# Patient Record
Sex: Female | Born: 1985 | Race: Black or African American | Hispanic: No | Marital: Single | Smoking: Former smoker
Health system: Southern US, Community
[De-identification: ages and names within clinical notes are randomized; demographics above are authoritative.]

## PROBLEM LIST (undated history)

## (undated) DIAGNOSIS — R7303 Prediabetes: Secondary | ICD-10-CM

## (undated) DIAGNOSIS — I10 Essential (primary) hypertension: Secondary | ICD-10-CM

## (undated) DIAGNOSIS — F329 Major depressive disorder, single episode, unspecified: Secondary | ICD-10-CM

## (undated) DIAGNOSIS — F32A Depression, unspecified: Secondary | ICD-10-CM

## (undated) DIAGNOSIS — F419 Anxiety disorder, unspecified: Secondary | ICD-10-CM

## (undated) HISTORY — DX: Prediabetes: R73.03

## (undated) HISTORY — PX: NO PAST SURGERIES: SHX2092

---
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
## 2007-05-04 ENCOUNTER — Emergency Department: Payer: Self-pay | Admitting: Emergency Medicine

## 2007-06-02 ENCOUNTER — Emergency Department: Payer: Self-pay | Admitting: Emergency Medicine

## 2008-12-03 ENCOUNTER — Emergency Department: Payer: Self-pay | Admitting: Emergency Medicine

## 2008-12-18 ENCOUNTER — Emergency Department: Payer: Self-pay | Admitting: Emergency Medicine

## 2009-01-29 ENCOUNTER — Emergency Department: Payer: Self-pay | Admitting: Emergency Medicine

## 2013-07-25 ENCOUNTER — Emergency Department: Payer: Self-pay | Admitting: Emergency Medicine

## 2013-08-11 ENCOUNTER — Inpatient Hospital Stay: Payer: Self-pay | Admitting: Psychiatry

## 2013-08-11 LAB — URINALYSIS, COMPLETE
BILIRUBIN, UR: NEGATIVE
GLUCOSE, UR: NEGATIVE mg/dL (ref 0–75)
Ketone: NEGATIVE
Leukocyte Esterase: NEGATIVE
NITRITE: NEGATIVE
PROTEIN: NEGATIVE
Ph: 6 (ref 4.5–8.0)
RBC,UR: 34 /HPF (ref 0–5)
Specific Gravity: 1.009 (ref 1.003–1.030)
Squamous Epithelial: 5
WBC UR: 3 /HPF (ref 0–5)

## 2013-08-11 LAB — COMPREHENSIVE METABOLIC PANEL
ALBUMIN: 3.9 g/dL (ref 3.4–5.0)
ANION GAP: 8 (ref 7–16)
Alkaline Phosphatase: 59 U/L
BUN: 14 mg/dL (ref 7–18)
Bilirubin,Total: 0.3 mg/dL (ref 0.2–1.0)
Calcium, Total: 8.6 mg/dL (ref 8.5–10.1)
Chloride: 107 mmol/L (ref 98–107)
Co2: 24 mmol/L (ref 21–32)
Creatinine: 0.89 mg/dL (ref 0.60–1.30)
EGFR (African American): >60
EGFR (Non-African Amer.): >60
Glucose: 98 mg/dL (ref 65–99)
Osmolality: 278 (ref 275–301)
Potassium: 3.5 mmol/L (ref 3.5–5.1)
SGOT(AST): 20 U/L (ref 15–37)
SGPT (ALT): 15 U/L (ref 12–78)
SODIUM: 139 mmol/L (ref 136–145)
TOTAL PROTEIN: 7.3 g/dL (ref 6.4–8.2)

## 2013-08-11 LAB — SALICYLATE LEVEL: Salicylates, Serum: <1.7 mg/dL

## 2013-08-11 LAB — DRUG SCREEN, URINE

## 2013-08-11 LAB — CBC
HCT: 34 % — ABNORMAL LOW (ref 35.0–47.0)
HGB: 11.5 g/dL — ABNORMAL LOW (ref 12.0–16.0)
MCH: 30.4 pg (ref 26.0–34.0)
MCHC: 33.7 g/dL (ref 32.0–36.0)
MCV: 90 fL (ref 80–100)
PLATELETS: 275 10*3/uL (ref 150–440)
RBC: 3.78 10*6/uL — ABNORMAL LOW (ref 3.80–5.20)
RDW: 14.6 % — ABNORMAL HIGH (ref 11.5–14.5)
WBC: 5.7 10*3/uL (ref 3.6–11.0)

## 2013-08-11 LAB — ETHANOL: Ethanol %: <0.003 % (ref 0.000–0.080)

## 2013-08-11 LAB — PREGNANCY, URINE: Pregnancy Test, Urine: NEGATIVE m[IU]/mL

## 2013-08-11 LAB — ACETAMINOPHEN LEVEL: Acetaminophen: 4 ug/mL — ABNORMAL LOW

## 2013-09-12 ENCOUNTER — Emergency Department: Payer: Self-pay | Admitting: Emergency Medicine

## 2013-09-13 LAB — CBC
HCT: 34.1 % — ABNORMAL LOW (ref 35.0–47.0)
HGB: 11.5 g/dL — ABNORMAL LOW (ref 12.0–16.0)
MCH: 30.8 pg (ref 26.0–34.0)
MCHC: 33.7 g/dL (ref 32.0–36.0)
MCV: 91 fL (ref 80–100)
Platelet: 313 10*3/uL (ref 150–440)
RBC: 3.74 10*6/uL — ABNORMAL LOW (ref 3.80–5.20)
RDW: 15 % — AB (ref 11.5–14.5)
WBC: 5.8 10*3/uL (ref 3.6–11.0)

## 2013-09-13 LAB — URINALYSIS, COMPLETE
BILIRUBIN, UR: NEGATIVE
Bacteria: NONE SEEN
Glucose,UR: NEGATIVE mg/dL (ref 0–75)
Ketone: NEGATIVE
Leukocyte Esterase: NEGATIVE
NITRITE: NEGATIVE
Ph: 6 (ref 4.5–8.0)
Protein: 30
RBC,UR: 8 /HPF (ref 0–5)
SPECIFIC GRAVITY: 1.009 (ref 1.003–1.030)
Squamous Epithelial: 11
WBC UR: 5 /HPF (ref 0–5)

## 2013-09-13 LAB — COMPREHENSIVE METABOLIC PANEL
ALK PHOS: 55 U/L
AST: 19 U/L (ref 15–37)
Albumin: 3.5 g/dL (ref 3.4–5.0)
Anion Gap: 12 (ref 7–16)
BUN: 9 mg/dL (ref 7–18)
Bilirubin,Total: 0.2 mg/dL (ref 0.2–1.0)
CHLORIDE: 108 mmol/L — AB (ref 98–107)
Calcium, Total: 8.2 mg/dL — ABNORMAL LOW (ref 8.5–10.1)
Co2: 23 mmol/L (ref 21–32)
Creatinine: 0.76 mg/dL (ref 0.60–1.30)
EGFR (African American): >60
EGFR (Non-African Amer.): >60
Glucose: 94 mg/dL (ref 65–99)
Osmolality: 283 (ref 275–301)
Potassium: 3.2 mmol/L — ABNORMAL LOW (ref 3.5–5.1)
SGPT (ALT): 20 U/L
SODIUM: 143 mmol/L (ref 136–145)
Total Protein: 7.3 g/dL (ref 6.4–8.2)

## 2013-09-13 LAB — DRUG SCREEN, URINE
Amphetamines, Ur Screen: NEGATIVE (ref ?–1000)
BENZODIAZEPINE, UR SCRN: NEGATIVE (ref ?–200)
Barbiturates, Ur Screen: NEGATIVE (ref ?–200)
CANNABINOID 50 NG, UR ~~LOC~~: NEGATIVE (ref ?–50)
Cocaine Metabolite,Ur ~~LOC~~: NEGATIVE (ref ?–300)
MDMA (ECSTASY) UR SCREEN: POSITIVE (ref ?–500)
Methadone, Ur Screen: NEGATIVE (ref ?–300)
Opiate, Ur Screen: NEGATIVE (ref ?–300)
PHENCYCLIDINE (PCP) UR S: NEGATIVE (ref ?–25)
TRICYCLIC, UR SCREEN: NEGATIVE (ref ?–1000)

## 2013-09-13 LAB — ACETAMINOPHEN LEVEL: Acetaminophen: <2 ug/mL

## 2013-09-13 LAB — ETHANOL
ETHANOL %: 0.167 % — AB (ref 0.000–0.080)
Ethanol: 167 mg/dL

## 2013-09-13 LAB — SALICYLATE LEVEL: SALICYLATES, SERUM: 2.7 mg/dL

## 2014-06-03 NOTE — H&P (Signed)
PATIENT NAME:  Samantha [name], Samantha [name] [name] MR#:  [id number] DATE OF BIRTH:  12-Nov-1985  DATE OF ADMISSION:  08/11/2013 DATE OF EVALUATION:  08/12/2013   REFERRING PHYSICIAN: Emergency Room M.D.   ATTENDING PHYSICIAN: Samantha [name] Samantha [name] [name], M.D.   IDENTIFYING DATA: Samantha [name] [name] is a 29 year old female with history of depression.   CHIEF COMPLAINT: "I felt down."   HISTORY OF PRESENT ILLNESS:  Samantha [name] Samantha [name] [name] reports frequent mood changes and bouts of depression since early childhood. Her family knows that she is moody. When faced with difficulties, she oftentimes becomes depressed and does not want to deal with her difficulties. She has never been treated for depression before. On the day of admission, she overdosed on 12 pills of naproxen. She denies that this was an attempt to kill herself, but she just wanted to feel numb. She has multiple stressors.  She lost her grandfather, with whom she was very close, in June. She is still tearful about it. She works 2 jobs and goes to school full time and takes care of her 31-year-old son. She has financial difficulties and also difficulties in relationships.  In addition, she was charged with assault with a deadly weapon and this limits her opportunities for employment now. She reports poor sleep, decreased appetite, anhedonia, feeling of guilt, hopelessness, worthlessness, poor memory and concentration, crying spells, irritability, poor impulse control, social isolation and passing suicidal ideation. Before she took the pills, she thought about it for a while. She lives with a friend, but a suicide was attempted at her parents house. She let them know and was brought to the hospital. She denies psychotic symptoms, denies symptoms suggestive of bipolar mania and does not think that she has bipolar illness. She has been drinking more so in the past month. She used to drink 2 to 3 times a week, but now she has at least 3 beers each day, oftentimes more. She denies illicit drugs or  prescription pill abuse.   PAST PSYCHIATRIC HISTORY: She has never been hospitalized. Never treated. No suicide attempts. No substance abuse treatments.   FAMILY PSYCHIATRIC HISTORY: None reported.   PAST MEDICAL HISTORY: None.   ALLERGIES: No known drug allergies.   MEDICATIONS ON ADMISSION: None.   SOCIAL HISTORY: She lives with a friend. She is not dating at the moment. She has 2 part-time jobs at [employer] and [employer]. She goes to school on line for [employer] She takes care of her son. She now thinks that maybe she will move in with her mother to lower the costs and take advantage of her support.   REVIEW OF SYSTEMS: CONSTITUTIONAL: No fevers or chills. No weight changes.  EYES: No double or blurred vision.  ENT: No hearing loss.  RESPIRATORY: No shortness of breath or cough.  CARDIOVASCULAR: No chest pain or orthopnea.  GASTROINTESTINAL: No abdominal pain, nausea, vomiting or diarrhea.  GENITOURINARY: No incontinence or frequency.  ENDOCRINE: No heat or cold intolerance.  LYMPHATIC: No anemia or easy bruising.  INTEGUMENTARY: No acne or rash.  MUSCULOSKELETAL: No muscle or joint pain.  NEUROLOGIC: No tingling or weakness.  PSYCHIATRIC: See history of present illness for details.   PHYSICAL EXAMINATION: VITAL SIGNS: Blood pressure 111/75, pulse 64, respirations 20, temperature 98.7.  GENERAL: This is a well-developed female in no acute distress.  HEENT: The pupils are equal, round and reactive to light. Sclerae are anicteric.  NECK: Supple. No thyromegaly.  LUNGS: Clear to auscultation. No dullness to percussion.  HEART: Regular rhythm  and rate. No murmurs, rubs or gallops.  ABDOMEN: Soft, nontender, nondistended. Positive bowel sounds.  MUSCULOSKELETAL: Normal muscle strength in all extremities.  SKIN: No rashes or bruises.  LYMPHATIC: No cervical adenopathy.  NEUROLOGIC: Cranial nerves II through XII are intact.   LABORATORY DATA: Chemistries are  within normal limits. Blood alcohol level is 0. LFTs within normal limits. Urinalysis is not suggestive of urinary tract infection. CBC within normal limits with mild anemia. Urine tox screen negative for substances. Serum acetaminophen and salicylate are low. Urine pregnancy test is negative.   MENTAL STATUS EXAMINATION ON ADMISSION: The patient is alert and oriented to person, place, time and situation. She is pleasant, polite and cooperative. She is well groomed and casually dressed. She maintains good eye contact. Her speech is of normal rhythm, rate and volume. Mood is depressed with flat affect. Thought process is logical and goal oriented. Thought content: She denies thoughts of hurting herself or others. There are no delusions or paranoia. There are no auditory or visual hallucinations. Her cognition is grossly intact. Registration, recall, long and short-term memory is intact. She is of average intelligence and fund of knowledge. Her insight and judgment are improving.   DIAGNOSES: AXIS I:  Major depressive disorder, recurrent, severe. Alcohol abuse.  AXIS II: Deferred.  AXIS III: Deferred.  AXIS IV: Mental illness, substance abuse, financial, primary support.  AXIS V: Global assessment of functioning is 35.   PLAN: The patient was admitted to [facility] behavioral medicine unit for safety, stabilization and medication management. She was initially placed on suicide precautions and was closely monitored for any unsafe behaviors. She underwent full psychiatric and risk assessment. She received pharmacotherapy, individual and group psychotherapy, substance abuse counseling and support from therapeutic milieu.  1.  Suicidal ideation: This has resolved. The patient is able to contract for safety.  2.  Mood: She interested in starting antidepressants. Will start Prozac for depression and trazodone for sleep.  3.  Disposition: She will be discharged to home. She will follow up  with [facility].      ____________________________ Ellin GoodieJolanta B. Jennet MaduroPucilowska, MD jbp:dmm D: 08/12/2013 13:39:45 ET T: 08/12/2013 14:32:39 ET JOB#: [id number]  cc: Samantha B. Jennet MaduroPucilowska, MD, <Dictator> Shari ProwsJOLANTA B PUCILOWSKA MD ELECTRONICALLY SIGNED 08/24/2013 4:50

## 2014-06-03 NOTE — Consult Note (Signed)
PATIENT NAME:  Samantha [name], Samantha [name] MR#:  [id number] DATE OF BIRTH:  1985-07-16  DATE OF CONSULTATION:  09/13/2013  REFERRING PHYSICIAN:   CONSULTING PHYSICIAN:  Audery Amel, MD  IDENTIFYING INFORMATION AND REASON FOR CONSULT: A 29 year old woman with a past history of depression who came into the hospital under IVC because of taking excessive trazodone. Consultation for appropriate disposition.   HISTORY OF PRESENT ILLNESS: Information obtained from the patient and the chart. The patient was brought here after taking an overdose of her trazodone. She takes prescription trazodone p.r.n. for sleep. She reports that on the night in question, she took 4 of the pills, which 400 mg according to her prescription. She states her reason for doing that was that she was really wanting to make sure she slept because 100-200 mg does not always work. She admits that she had been drinking 2 or 3 drinks at the time. She denies that she had any suicidal ideation at all. She does not report that she had had a particularly stressful or upsetting or unpleasant day at the time. She does not admit that she has been feeling particularly depressed. Totally denies suicidal ideation. Says that she only needs trazodone occasionally. She is not taking the Prozac that was prescribed when she was in the hospital last month. She just did not think that it was helpful. She did not go to follow up with outpatient mental health care. The patient had some stress in her life including being a single mother of a 52-year-old child, working at a low wage job, living in a very crowded house. She admits that all of this is stressful, but is not reporting that is making her feel hopeless. Instead, she is able to articulate positive plans for the future.   PAST PSYCHIATRIC HISTORY: She was admitted to the hospital in the beginning of July of this year after taking an overdose. She tells me now that at that time she actually was trying to hurt  herself because she was feeling depressed, but that she did not do the same thing this time. She says that she understands why her stepmother petitioned her because she thought that it was the same kind of event, but the patient denies that it was. Denies any other suicide attempts. Denies any other psychiatric hospitalizations. Denies any other use of mental health medication. She does still admit to drinking on a regular basis, although she is saying it is only a few times a week. Tends to minimize it a little bit, not really feeling like is a very significant problem right now. She denies any other drug abuse.   FAMILY HISTORY: No known family history.   SOCIAL HISTORY: The patient has a 12-year-old son. She lives in a house with 6 of her own younger siblings and her mother, as well as her own child. She works in a [employer]. 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 She admits that this is stressful, but says that she feels positive about coping with it. She is not currently in any kind of significant relationship.   PAST MEDICAL HISTORY: Denies any known significant ongoing medical problems.   SUBSTANCE ABUSE HISTORY: When she was here previously, alcohol abuse was identified as a problem. The patient seems to have not done a great deal about it, although she is reporting less frequent drinking now than she was previously. She is denying any drug use, although she does have MDMA  positive on her drug screen.   CURRENT MEDICATIONS: Trazodone 100 mg p.o. at bedtime p.r.n. for sleep.   ALLERGIES: NO KNOWN DRUG ALLERGIES.   REVIEW OF SYSTEMS: Feeling tired and sleepy this morning. Denies any other physical symptoms. Full review of systems negative.   MENTAL STATUS EXAMINATION: Neatly groomed woman, looks her stated age, cooperative with the interview. Eye contact initially limited as she is waking up, but then becomes appropriate. Psychomotor activity sluggish. Speech is  quiet, but easy to understand. Answers questions fairly fully and appropriately. Affect warmed up as we were talking with smiling later. No tearfulness or depression. Denies suicidal or homicidal ideation. Denies auditory or visual hallucinations. The patient is alert and oriented x 4. Has memory 2 out of 3 objects at 3 minutes. Longer term memory intact. Seems to be of normal intelligence.   LABORATORY RESULTS: Her drug screen is positive for MDMA. I asked her whether she was using any other drugs at all and she denied it. Also is not on any medications that would normally cross react with that. Her chemistry panel shows slightly low potassium of 3.2, nothing else significant.  Alcohol level was 167 when she came in. CBC: Slightly anemic, hematocrit of 34.   URINALYSIS: Positive for protein, probably contaminated with epithelial cells. Pregnancy test negative.   VITAL SIGNS: Most recent blood pressure 96/68, respirations 12, pulse 70, temperature 98.8.   ASSESSMENT:  A 29 year old woman took what she is insisting with a non-suicidal overdose of trazodone. Had been drinking and was mildly intoxicated at the time. Totally denies depression or suicidal symptoms right now. Seems to show pretty good insight about her situation. Has positive plans for the future and is not appear to have a major depression, obviously, going on right now. The patient does not require inpatient hospitalization. She would benefit from being referred back to going to outpatient treatment evaluation. She is not taking the antidepressant, and I see no reason to restart it right now.   TREATMENT PLAN: The patient has been educated about the risks of alcohol use and the way it can lead to impulsive of bad decisions. Encouraged to give that some serious thought about whether the alcohol is a problem. Referred back voluntarily to [facility] for [facility] walk[facility]in [facility].   DIAGNOSIS, PRINCIPAL AND PRIMARY: AXIS I:  Depression, not  otherwise specified.   SECONDARY DIAGNOSES:  AXIS I: Alcohol abuse.  AXIS II: Deferred.  AXIS III: Recovering from trazodone overdose. No sequela.  AXIS IV: Moderate to severe ongoing social stress as noted above.  AXIS V: Functioning at time of evaluation 55.    ____________________________ Audery AmelJohn T. Clapacs, MD jtc:ts D: 09/13/2013 11:05:10 ET T: 09/13/2013 11:14:06 ET JOB#: [id number]  cc: Audery AmelJohn T. Clapacs, MD, <Dictator> Audery AmelJOHN T CLAPACS MD ELECTRONICALLY SIGNED 09/21/2013 0:41

## 2014-06-03 NOTE — Consult Note (Signed)
PATIENT NAME:  Mikle BosworthSOWELL, Josette M MR#:  [id number] DATE OF BIRTH:  1985/07/31  DATE OF CONSULTATION:  08/11/2013  REFERRING PHYSICIAN:  Minna AntisKevin Paduchowski, MD CONSULTING PHYSICIAN:  Ardeen FillersUzma S. Garnetta BuddyFaheem, MD  REASON FOR CONSULTATION: "I overdosed on the pills."  HISTORY OF PRESENT ILLNESS: The patient is a 29 year old female who presented via EMS from home after she took 12 naproxen pills, 500 mg each, at around 7:00 this morning. She was having suicidal ideations and she wanted to hurt herself. She reported that she has been going through depression and having a lot of problems. She just wanted to sleep. She called the ambulance after she took the overdose on the naproxen. She reported that she has been having suicidal ideations for a long period of time. She was thinking about suicide and was having plans of using some kind of pills. The patient reported that she has been feeling depressed for a long time. She was dealing with sadness, depression, and decreased appetite and was thinking about different stuff. She feels hopeless and helpless with crying spells. The patient currently lives with a friend. 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 She reported that she is unable to control herself. The patient currently denied using any drugs or alcohol at this time. She reported that after she actually took the pills she thought about her 27-year-old son and then decided to call for help. She is unable to contract for safety at this time.   PAST PSYCHIATRIC HISTORY: The patient reported that she has history of depression for a long period of time, but she is not taking any psychotropic medications at this time. She is asking for help. She stated that she has never been admitted to a psychiatric hospital, but she wants to see one. She has never been treated for depression in the past.   FAMILY HISTORY: The patient currently denied any family history of depression as well as suicide attempts.    SUBSTANCE ABUSE HISTORY: The patient reported that she drinks occasionally, but now for the past few days she has been drinking almost on a daily basis, either beer or wine, to help with her depression. She denies any use of illicit drugs, but she has used pills in the past.   MEDICAL HISTORY: The patient currently denied having any medical problems.   ALLERGIES: No known drug allergies.   CURRENT MEDICATIONS: None reported.   SOCIAL HISTORY: The patient currently lives with a roommate. She has a 27-year-old son who lives with her. She has family who lives around her.   ANCILLARY DATA: Temperature 98.3, pulse 74, respirations 16, blood pressure 122/81.  LABORATORY DATA: Glucose 98, BUN 14, creatinine 0.89, sodium 139, potassium 3.5, chloride 107, bicarbonate 24, anion gap 8, osmolality 278, calcium 8.6. Blood alcohol level less than 3. Protein 7.3, albumin 3.9, bilirubin 0.3, alkaline phosphatase 59, AST 20, ALT 15. UDS is negative. WBC 5.7, RBC 3.78, hemoglobin 11.5, hematocrit 34, platelet count 275,000, MCV 90 and RDW is 14.6   REVIEW OF SYSTEMS: CONSTITUTIONAL: Denies any fever or chills. No weight changes.  EYES: No double or blurred vision.  RESPIRATORY: No shortness of breath or cough.  CARDIOVASCULAR: No chest pain or orthopnea.  GASTROINTESTINAL: No abdominal pain, nausea, vomiting or diarrhea.  GENITOURINARY: No incontinence or frequency.  ENDOCRINE: No heat or cold intolerance.  LYMPHATIC: No anemia or easy bruising.  INTEGUMENTARY: No acne or rash.  MUSCULOSKELETAL: No muscle or joint pain.  NEUROLOGIC: No  tingling or weakness.   MENTAL STATUS EXAMINATION: The patient is a moderately built female who was lying in the ED room. She appeared tired and depressed. Her muscle tone appears normal. Gait and station was not tested. Speech was low in tone and volume. Mood was depressed. Affect was congruent. No loose associations are noted. Thought process was logical, goal-directed.  She just attempted suicide and is unable to contract for safety. Her insight and judgment are poor. She was awake and somewhat sedated. Recent and remote memory were intact. Attention span and concentration were short. Her language and fund of knowledge appears appropriate. Mood was depressed and affect was blunted.   DIAGNOSTIC IMPRESSION: AXIS I: Major depressive disorder, recurrent, severe, without psychotic features.  AXIS II: None.  AXIS III: None reported.   TREATMENT PLAN: 1.  The patient will be admitted to the inpatient behavioral health unit for stabilization and safety.  2.  Once she becomes clinically stable, she will be started on the medications by the treating physician in the unit. I will not start her on any medications at this time. The patient agreed for admission at this time. She will be monitored closely by the staff.   Thank you for allowing me to participate in the care of this patient.   ____________________________ Ardeen Fillers. Garnetta Buddy, MD usf:sb D: 08/11/2013 12:05:11 ET T: 08/11/2013 12:38:58 ET JOB#: [id number]  cc: Ardeen Fillers. Garnetta Buddy, MD, <Dictator> Rhunette Croft MD ELECTRONICALLY SIGNED 08/14/2013 11:52

## 2014-12-22 ENCOUNTER — Ambulatory Visit
Admission: EM | Admit: 2014-12-22 | Discharge: 2014-12-22 | Disposition: A | Discharge status: Home / Self Care | Payer: Medicaid Other | Attending: Family Medicine | Admitting: Family Medicine

## 2014-12-22 DIAGNOSIS — R300 Dysuria: Secondary | ICD-10-CM | POA: Insufficient documentation

## 2014-12-22 DIAGNOSIS — F17210 Nicotine dependence, cigarettes, uncomplicated: Secondary | ICD-10-CM | POA: Diagnosis not present

## 2014-12-22 LAB — URINALYSIS COMPLETE WITH MICROSCOPIC (ARMC ONLY): pH: 0 — ABNORMAL LOW (ref 5.0–8.0)

## 2014-12-22 LAB — CHLAMYDIA/NGC RT PCR (ARMC ONLY)
Chlamydia Tr: NOT DETECTED
N GONORRHOEAE: NOT DETECTED

## 2014-12-22 LAB — PREGNANCY, URINE: PREG TEST UR: NEGATIVE

## 2014-12-22 NOTE — ED Provider Notes (Addendum)
CSN: [id number]     Arrival date & time 12/22/14  [id number] History   None    Chief Complaint  Patient presents with  . Cystitis   (Consider location/radiation/quality/duration/timing/severity/associated sxs/prior Treatment) HPI Comments: 29 yo female with h/o urinary frequency and suprapubic discomfort for the last 2-3 days. Denies any fevers, chills, vomiting, back pain. States took some otc AZO several hours ago, this morning.  The history is provided by the patient.    History reviewed. No pertinent past medical history. History reviewed. No pertinent past surgical history. History reviewed. No pertinent family history. Social History  Substance Use Topics  . Smoking status: Current Every Day Smoker -- 0.50 packs/day    Types: Cigarettes  . Smokeless tobacco: None  . Alcohol Use: Yes     Comment: socially   OB History    Gravida Para Term Preterm AB TAB SAB Ectopic Multiple Living   2    1     1      Review of Systems  Allergies  Review of patient's allergies indicates no known allergies.  Home Medications   Prior to Admission medications   Medication Sig Start Date End Date Taking? Authorizing Provider  phenazopyridine (PYRIDIUM) 95 MG tablet Take 95 mg by mouth 3 (three) times daily as needed for pain.   Yes Historical Provider, MD  Phenazopyridine HCl & UTI Test (URISTAT UTI RELIEF PAK CO) by Combination route.   Yes Historical Provider, MD  sulfamethoxazole-trimethoprim (BACTRIM DS,SEPTRA DS) 800-160 MG tablet Take 1 tablet by mouth 2 (two) times daily. 12/25/14   Darien Ramusavid W Kaminski, MD   Meds Ordered and Administered this Visit  Medications - No data to display  BP 114/72 mmHg  Pulse 80  Temp(Src) 97.1 F (36.2 C) (Tympanic)  Resp 16  Ht 5\' 5"  (1.651 m)  Wt 138 lb (62.596 kg)  BMI 22.96 kg/m2  SpO2 99%  LMP 12/14/2014 (Exact Date) No data found.   Physical Exam  Constitutional: She appears well-developed and well-nourished. No distress.  Cardiovascular:  Normal rate.   Pulmonary/Chest: Effort normal. No respiratory distress.  Abdominal: Soft. Bowel sounds are normal. She exhibits no distension. There is no tenderness. There is no rebound and no guarding.  Skin: She is not diaphoretic.  Nursing note and vitals reviewed.   ED Course  Procedures (including critical care time)  Labs Review Labs Reviewed  URINALYSIS COMPLETEWITH MICROSCOPIC ([facility] ONLY) - Abnormal; Notable for the following:    Color, Urine ORANGE (*)    APPearance CLOUDY (*)    Glucose, UA   (*)    Value: TEST NOT REPORTED DUE TO COLOR INTERFERENCE OF URINE PIGMENT   Bilirubin Urine   (*)    Value: TEST NOT REPORTED DUE TO COLOR INTERFERENCE OF URINE PIGMENT   Ketones, ur   (*)    Value: TEST NOT REPORTED DUE TO COLOR INTERFERENCE OF URINE PIGMENT   Hgb urine dipstick   (*)    Value: TEST NOT REPORTED DUE TO COLOR INTERFERENCE OF URINE PIGMENT   pH 0.0 (*)    Protein, ur   (*)    Value: TEST NOT REPORTED DUE TO COLOR INTERFERENCE OF URINE PIGMENT   Nitrite   (*)    Value: TEST NOT REPORTED DUE TO COLOR INTERFERENCE OF URINE PIGMENT   Leukocytes, UA   (*)    Value: TEST NOT REPORTED DUE TO COLOR INTERFERENCE OF URINE PIGMENT   Bacteria, UA MANY (*)    Squamous Epithelial / LPF  0-5 (*)    All other components within normal limits  URINE CULTURE  CHLAMYDIA/NGC RT PCR ([facility] ONLY)  PREGNANCY, URINE    Imaging Review No results found.   Visual Acuity Review  Right Eye Distance:   Left Eye Distance:   Bilateral Distance:    Right Eye Near:   Left Eye Near:    Bilateral Near:         MDM   1. Dysuria    1. Lab result (negative UA) and possible diagnosis 2. Recommend supportive treatment with increased water intake 3. Check urine culture 4. Follow-up prn if symptoms worsen or don't improve  Discharge Medication List as of 12/22/2014 10:45 AM        Payton Mccallum, MD 12/26/14 [id number]  Payton Mccallum, MD 12/27/14 [phone]

## 2014-12-22 NOTE — ED Notes (Signed)
Started first of this week with urinary frequency and burning and suprapubic pain

## 2014-12-24 LAB — URINE CULTURE: Culture: >=100000

## 2014-12-25 ENCOUNTER — Emergency Department
Admission: EM | Admit: 2014-12-25 | Discharge: 2014-12-25 | Disposition: A | Discharge status: Home / Self Care | Payer: Medicaid Other | Attending: Emergency Medicine | Admitting: Emergency Medicine

## 2014-12-25 ENCOUNTER — Encounter: Payer: Self-pay | Admitting: Emergency Medicine

## 2014-12-25 DIAGNOSIS — N390 Urinary tract infection, site not specified: Secondary | ICD-10-CM | POA: Insufficient documentation

## 2014-12-25 DIAGNOSIS — Z3202 Encounter for pregnancy test, result negative: Secondary | ICD-10-CM | POA: Diagnosis not present

## 2014-12-25 DIAGNOSIS — F17210 Nicotine dependence, cigarettes, uncomplicated: Secondary | ICD-10-CM | POA: Diagnosis not present

## 2014-12-25 DIAGNOSIS — R109 Unspecified abdominal pain: Secondary | ICD-10-CM | POA: Diagnosis present

## 2014-12-25 LAB — URINALYSIS COMPLETE WITH MICROSCOPIC (ARMC ONLY)
Bilirubin Urine: NEGATIVE
Glucose, UA: NEGATIVE mg/dL
Ketones, ur: NEGATIVE mg/dL
Nitrite: POSITIVE — AB
PH: 6 (ref 5.0–8.0)
Protein, ur: 100 mg/dL — AB
Specific Gravity, Urine: 1.013 (ref 1.005–1.030)

## 2014-12-25 LAB — POCT PREGNANCY, URINE: PREG TEST UR: NEGATIVE

## 2014-12-25 MED ORDER — SULFAMETHOXAZOLE-TRIMETHOPRIM 800-160 MG PO TABS: 1.0000 | ORAL_TABLET | Freq: Two times a day (BID) | ORAL | Status: DC

## 2014-12-25 MED ORDER — SULFAMETHOXAZOLE-TRIMETHOPRIM 800-160 MG PO TABS
1.0000 | ORAL_TABLET | Freq: Once | ORAL | Status: AC
  Administered 2014-12-25: 1 via ORAL
  Filled 2014-12-25: qty 1

## 2014-12-25 NOTE — ED Notes (Signed)
Pt reports painful urination and odor for about one week. Denies any vaginal discharge. [facility] same but is worse.

## 2014-12-25 NOTE — Discharge Instructions (Signed)
Your urine culture shows you have pansensitive Escherichia coli bacteria. Take Bactrim as prescribed. Follow-up with your regular doctor or with [facility] if you're not getting better. Return to the emergency department if you feel worse, if you have a fever, or if you have other urgent concerns.  Urinary Tract Infection A urinary tract infection (UTI) can occur any place along the urinary tract. The tract includes the kidneys, ureters, bladder, and urethra. A type of germ called bacteria often causes a UTI. UTIs are often helped with antibiotic medicine.  HOME CARE   If given, take antibiotics as told by your doctor. Finish them even if you start to feel better.  Drink enough fluids to keep your pee (urine) clear or pale yellow.  Avoid tea, drinks with caffeine, and bubbly (carbonated) drinks.  Pee often. Avoid holding your pee in for a long time.  Pee before and after having sex (intercourse).  Wipe from front to back after you poop (bowel movement) if you are a woman. Use each tissue only once. GET HELP RIGHT AWAY IF:   You have back pain.  You have lower belly (abdominal) pain.  You have chills.  You feel sick to your stomach (nauseous).  You throw up (vomit).  Your burning or discomfort with peeing does not go away.  You have a fever.  Your symptoms are not better in 3 days. MAKE SURE YOU:   Understand these instructions.  Will watch your condition.  Will get help right away if you are not doing well or get worse.   This information is not intended to replace advice given to you by your health care provider. Make sure you discuss any questions you have with your health care provider.   Document Released: 07/16/2007 Document Revised: 02/17/2014 Document Reviewed: 08/28/2011 Elsevier Interactive Patient Education [employer]2016 [employer]

## 2014-12-25 NOTE — ED Notes (Signed)
Pt to ed with c/o lower abd pain and pelvic pain,  Pt states urine has strong foul odor and is orange in color. Denies vaginal d/c.

## 2014-12-25 NOTE — ED Notes (Signed)
Final report of urine route of STD testing negative

## 2014-12-25 NOTE — ED Provider Notes (Addendum)
[facility] Emergency Department Provider Note  ____________________________________________  Time seen: 1510  I have reviewed the triage vital signs and the nursing notes.   HISTORY  Chief Complaint Pelvic Pain  dysuria    HPI VANDELLA ORD is a 29 y.o. female who reports she has been having discomfort with urination for proximally one week. This past Friday she went to [facility] urgent care. She had been taking Azo, which likely interfered with urinalysis, however she was told she did not have a urinary tract infection. They did obtain a culture. The culture shows that she has pansensitive Escherichia coli.  The patient presents to the emergency department today because her symptoms are continuing. She denies any fever. She does have some discomfort in her lower abdomen pelvic area. She denies any fever, nausea, vomiting, or diarrhea.  Patient denies any vaginal discharge or vaginal bleeding.     History reviewed. No pertinent past medical history.  There are no active problems to display for this patient.   History reviewed. No pertinent past surgical history.  Current Outpatient Rx  Name  Route  Sig  Dispense  Refill  . phenazopyridine (PYRIDIUM) 95 MG tablet   Oral   Take 95 mg by mouth 3 (three) times daily as needed for pain.         Marland Kitchen Phenazopyridine HCl & UTI Test (URISTAT UTI RELIEF PAK CO)   Combination   by Combination route.         . sulfamethoxazole-trimethoprim (BACTRIM DS,SEPTRA DS) 800-160 MG tablet   Oral   Take 1 tablet by mouth 2 (two) times daily.   11 tablet   0     Allergies Review of patient's allergies indicates no known allergies.  History reviewed. No pertinent family history.  Social History Social History  Substance Use Topics  . Smoking status: Current Every Day Smoker -- 0.50 packs/day    Types: Cigarettes  . Smokeless tobacco: None  . Alcohol Use: Yes     Comment: socially    Review of  Systems  Constitutional: Negative for fatigue. ENT: Negative for congestion. Cardiovascular: Negative for chest pain. Respiratory: Negative for cough. Gastrointestinal: Negative for abdominal pain, vomiting and diarrhea. Genitourinary: Positive for dysuria and lower pelvic pain.. Musculoskeletal: No myalgias or injuries. Skin: Negative for rash. Neurological: Negative for headache or focal weakness   10-point ROS otherwise negative.  ____________________________________________   PHYSICAL EXAM:  VITAL SIGNS: ED Triage Vitals  Enc Vitals Group     BP 12/25/14 1153 106/70 mmHg     Pulse Rate 12/25/14 1153 81     Resp 12/25/14 1153 18     Temp 12/25/14 1153 98.5 F (36.9 C)     Temp Source 12/25/14 1153 Oral     SpO2 12/25/14 1153 100 %     Weight 12/25/14 1153 135 lb (61.236 kg)     Height 12/25/14 1153  (1.651 m)     Head Cir --      Peak Flow --      Pain Score 12/25/14 0946 10     Pain Loc --      Pain Edu? --      Excl. in GC? --     Constitutional: Alert and oriented. Well appearing and in no distress. ENT   Head: Normocephalic and atraumatic.   Nose: No congestion/rhinnorhea.       Mouth: No erythema, no swelling   Cardiovascular: Normal rate, regular rhythm, no murmur noted Respiratory:  Normal respiratory effort, no tachypnea.    Breath sounds are clear and equal bilaterally.  Gastrointestinal: Soft. No distention.  Minimal discomfort in the suprapubic area. Central with no lateral quality. Back: No muscle spasm, no tenderness, no CVA tenderness. Musculoskeletal: No deformity noted. Nontender with normal range of motion in all extremities.  No noted edema. Neurologic:  Communicative. Normal appearing spontaneous movement in all 4 extremities. No gross focal neurologic deficits are appreciated.  Skin:  Skin is warm, dry. No rash noted. Psychiatric: Mood and affect are normal. Speech and behavior are normal.   ____________________________________________    LABS (pertinent positives/negatives)  Labs Reviewed  URINALYSIS COMPLETEWITH MICROSCOPIC ([facility] ONLY) - Abnormal; Notable for the following:    Color, Urine YELLOW (*)    APPearance CLOUDY (*)    Hgb urine dipstick 3+ (*)    Protein, ur 100 (*)    Nitrite POSITIVE (*)    Leukocytes, UA 3+ (*)    Bacteria, UA RARE (*)    Squamous Epithelial / LPF 0-5 (*)    All other components within normal limits  POC URINE PREG, ED  POCT PREGNANCY, URINE     ____________________________________________   INITIAL IMPRESSION / ASSESSMENT AND PLAN / ED COURSE  Pertinent labs & imaging results that were available during my care of the patient were reviewed by me and considered in my medical decision making (see chart for details).  3 pleasant 29 year old female who has a notable urinary tract infection with a urinalysis that shows too many to count red blood cells and too many count white blood cells with white blood cell clumps. A recent culture from November 11 shows pansensitive Escherichia coli. We will place her on Bactrim, first dose now and initiating home treatment with another dose this evening. She'll continue on Bactrim twice a day. I've asked her to follow up with the physician on her Medicaid card or [facility] if she is not improving. If she has fever or feels worse, she should return to the emergency department and has been advised to do so.  ____________________________________________   FINAL CLINICAL IMPRESSION(S) / ED DIAGNOSES  Final diagnoses:  UTI (lower urinary tract infection)      Darien Ramusavid W Allice Garro, MD 12/25/14 1527  Darien Ramusavid W Bohden Dung, MD 12/25/14 [phone]

## 2015-02-06 ENCOUNTER — Emergency Department
Admission: EM | Admit: 2015-02-06 | Discharge: 2015-02-06 | Disposition: A | Discharge status: Home / Self Care | Payer: Medicaid Other | Attending: Emergency Medicine | Admitting: Emergency Medicine

## 2015-02-06 ENCOUNTER — Encounter: Payer: Self-pay | Admitting: Emergency Medicine

## 2015-02-06 DIAGNOSIS — R51 Headache: Secondary | ICD-10-CM | POA: Diagnosis present

## 2015-02-06 DIAGNOSIS — M545 Low back pain: Secondary | ICD-10-CM | POA: Diagnosis not present

## 2015-02-06 DIAGNOSIS — R519 Headache, unspecified: Secondary | ICD-10-CM

## 2015-02-06 DIAGNOSIS — F17210 Nicotine dependence, cigarettes, uncomplicated: Secondary | ICD-10-CM | POA: Diagnosis not present

## 2015-02-06 DIAGNOSIS — Z79899 Other long term (current) drug therapy: Secondary | ICD-10-CM | POA: Diagnosis not present

## 2015-02-06 DIAGNOSIS — R109 Unspecified abdominal pain: Secondary | ICD-10-CM

## 2015-02-06 DIAGNOSIS — Z3202 Encounter for pregnancy test, result negative: Secondary | ICD-10-CM | POA: Insufficient documentation

## 2015-02-06 LAB — BASIC METABOLIC PANEL
ANION GAP: 7 (ref 5–15)
BUN: 15 mg/dL (ref 6–20)
CHLORIDE: 104 mmol/L (ref 101–111)
CO2: 26 mmol/L (ref 22–32)
Calcium: 9.2 mg/dL (ref 8.9–10.3)
Creatinine, Ser: 0.77 mg/dL (ref 0.44–1.00)
GFR calc non Af Amer: >60 mL/min (ref >60)
Glucose, Bld: 92 mg/dL (ref 65–99)
POTASSIUM: 3.5 mmol/L (ref 3.5–5.1)
Sodium: 137 mmol/L (ref 135–145)

## 2015-02-06 LAB — URINALYSIS COMPLETE WITH MICROSCOPIC (ARMC ONLY)
BILIRUBIN URINE: NEGATIVE
Bacteria, UA: NONE SEEN
Glucose, UA: NEGATIVE mg/dL
Ketones, ur: NEGATIVE mg/dL
Leukocytes, UA: NEGATIVE
Nitrite: NEGATIVE
PH: 5 (ref 5.0–8.0)
PROTEIN: 100 mg/dL — AB
Specific Gravity, Urine: 1.026 (ref 1.005–1.030)

## 2015-02-06 LAB — CBC WITH DIFFERENTIAL/PLATELET
BASOS ABS: 0 10*3/uL (ref 0–0.1)
BASOS PCT: 1 %
Eosinophils Absolute: 0.1 10*3/uL (ref 0–0.7)
Eosinophils Relative: 2 %
HEMATOCRIT: 32.6 % — AB (ref 35.0–47.0)
HEMOGLOBIN: 10.9 g/dL — AB (ref 12.0–16.0)
Lymphocytes Relative: 43 %
Lymphs Abs: 2.6 10*3/uL (ref 1.0–3.6)
MCH: 29.4 pg (ref 26.0–34.0)
MCHC: 33.5 g/dL (ref 32.0–36.0)
MCV: 87.7 fL (ref 80.0–100.0)
MONOS PCT: 7 %
Monocytes Absolute: 0.4 10*3/uL (ref 0.2–0.9)
NEUTROS ABS: 2.9 10*3/uL (ref 1.4–6.5)
NEUTROS PCT: 47 %
Platelets: 312 10*3/uL (ref 150–440)
RBC: 3.72 MIL/uL — ABNORMAL LOW (ref 3.80–5.20)
RDW: 13.8 % (ref 11.5–14.5)
WBC: 6.1 10*3/uL (ref 3.6–11.0)

## 2015-02-06 LAB — POCT PREGNANCY, URINE: Preg Test, Ur: NEGATIVE

## 2015-02-06 MED ORDER — KETOROLAC TROMETHAMINE 30 MG/ML IJ SOLN: 60.0000 mg | Freq: Once | INTRAMUSCULAR | Status: DC

## 2015-02-06 MED ORDER — IBUPROFEN 800 MG PO TABS: 800.0000 mg | ORAL_TABLET | Freq: Three times a day (TID) | ORAL | Status: DC | PRN

## 2015-02-06 MED ORDER — DIPHENHYDRAMINE HCL 25 MG PO CAPS: 50.0000 mg | ORAL_CAPSULE | Freq: Once | ORAL | Status: DC

## 2015-02-06 MED ORDER — METOCLOPRAMIDE HCL 5 MG/ML IJ SOLN: 10.0000 mg | Freq: Once | INTRAMUSCULAR | Status: DC

## 2015-02-06 MED ORDER — DIPHENHYDRAMINE HCL 50 MG/ML IJ SOLN: 50.0000 mg | Freq: Once | INTRAMUSCULAR | Status: DC

## 2015-02-06 MED ORDER — SODIUM CHLORIDE 0.9 % IV SOLN: 1000.0000 mL | Freq: Once | INTRAVENOUS | Status: DC

## 2015-02-06 NOTE — Discharge Instructions (Signed)
Abdominal Pain, Adult °Many things can cause abdominal pain. Usually, abdominal pain is not caused by a disease and will improve without treatment. It can often be observed and treated at home. Your health care provider will do a physical exam and possibly order blood tests and X-rays to help determine the seriousness of your pain. However, in many cases, more time must pass before a clear cause of the pain can be found. Before that point, your health care provider may not know if you need more testing or further treatment. °HOME CARE INSTRUCTIONS °Monitor your abdominal pain for any changes. The following actions may help to alleviate any discomfort you are experiencing: °· Only take over-the-counter or prescription medicines as directed by your health care provider. °· Do not take laxatives unless directed to do so by your health care provider. °· Try a clear liquid diet (broth, tea, or water) as directed by your health care provider. Slowly move to a bland diet as tolerated. °SEEK MEDICAL CARE IF: °· You have unexplained abdominal pain. °· You have abdominal pain associated with nausea or diarrhea. °· You have pain when you urinate or have a bowel movement. °· You experience abdominal pain that wakes you in the night. °· You have abdominal pain that is worsened or improved by eating food. °· You have abdominal pain that is worsened with eating fatty foods. °· You have a fever. °SEEK IMMEDIATE MEDICAL CARE IF: °· Your pain does not go away within 2 hours. °· You keep throwing up (vomiting). °· Your pain is felt only in portions of the abdomen, such as the right side or the left lower portion of the abdomen. °· You pass bloody or black tarry stools. °MAKE SURE YOU: °· Understand these instructions. °· Will watch your condition. °· Will get help right away if you are not doing well or get worse. °  °This information is not intended to replace advice given to you by your health care provider. Make sure you discuss  any questions you have with your health care provider. °  °Document Released: 11/06/2004 Document Revised: 10/18/2014 Document Reviewed: 10/06/2012 °Elsevier Interactive Patient Education ©2016 [employer] ° °General Headache Without Cause °A headache is pain or discomfort felt around the head or neck area. The specific cause of a headache may not be found. There are many causes and types of headaches. A few common ones are: °· Tension headaches. °· Migraine headaches. °· Cluster headaches. °· Chronic daily headaches. °HOME CARE INSTRUCTIONS  °Watch your condition for any changes. Take these steps to help with your condition: °Managing Pain °· Take over-the-counter and prescription medicines only as told by your health care provider. °· Lie down in a dark, quiet room when you have a headache. °· If directed, apply ice to the head and neck area: °¨ Put ice in a plastic bag. °¨ Place a towel between your skin and the bag. °¨ Leave the ice on for 20 minutes, 2-3 times per day. °· Use a heating pad or hot shower to apply heat to the head and neck area as told by your health care provider. °· Keep lights dim if bright lights bother you or make your headaches worse. °Eating and Drinking °· Eat meals on a regular schedule. °· Limit alcohol use. °· Decrease the amount of caffeine you drink, or stop drinking caffeine. °General Instructions °· Keep all follow-up visits as told by your health care provider. This is important. °· Keep a headache journal to help   find out what may trigger your headaches. For example, write down: °¨ What you eat and drink. °¨ How much sleep you get. °¨ Any change to your diet or medicines. °· Try massage or other relaxation techniques. °· Limit stress. °· Sit up straight, and do not tense your muscles. °· Do not use tobacco products, including cigarettes, chewing tobacco, or e-cigarettes. If you need help quitting, ask your health care provider. °· Exercise regularly as told by your health care  provider. °· Sleep on a regular schedule. Get 7-9 hours of sleep, or the amount recommended by your health care provider. °SEEK MEDICAL CARE IF:  °· Your symptoms are not helped by medicine. °· You have a headache that is different from the usual headache. °· You have nausea or you vomit. °· You have a fever. °SEEK IMMEDIATE MEDICAL CARE IF:  °· Your headache becomes severe. °· You have repeated vomiting. °· You have a stiff neck. °· You have a loss of vision. °· You have problems with speech. °· You have pain in the eye or ear. °· You have muscular weakness or loss of muscle control. °· You lose your balance or have trouble walking. °· You feel faint or pass out. °· You have confusion. °  °This information is not intended to replace advice given to you by your health care provider. Make sure you discuss any questions you have with your health care provider. °  °Document Released: 01/27/2005 Document Revised: 10/18/2014 Document Reviewed: 05/22/2014 °Elsevier Interactive Patient Education ©2016 [employer] Inc. ° °

## 2015-02-06 NOTE — ED Notes (Signed)
Went to medicate pt and she was not in the room, waited for pt to return and she did not. Pt will be removed from the computer.

## 2015-02-06 NOTE — ED Provider Notes (Signed)
CSN: [id number]     Arrival date & time 02/06/15  2002 History   First MD Initiated Contact with Patient 02/06/15 2128     Chief Complaint  Patient presents with  . Headache  . Abdominal Cramping  . Back Pain     (Consider location/radiation/quality/duration/timing/severity/associated sxs/prior Treatment) HPI  29 year old female presents to the emergency department for evaluation of headache and abdominal cramping. Patient has had a headache that has been off and on for 7 days. She describes it as a constant tightness around the front of her forehead and around both eyes. It isn't exacerbated with light and sound. She has had mild intermittent nausea. She has taken aspirin with intermittent relief. She denies any trauma or injury. No nausea or vomiting. This is not the worst headache of her life. Patient also has lower abdominal cramping along the abdomen and lower back. She denies any diarrhea. Pain is mild and increased with activity. Relief with rest. She denies any fevers, vaginal bleeding, vaginal discharge. Patient's last menstrual period was one and a half weeks ago.  History reviewed. No pertinent past medical history. History reviewed. No pertinent past surgical history. No family history on file. Social History  Substance Use Topics  . Smoking status: Current Every Day Smoker -- 0.50 packs/day    Types: Cigarettes  . Smokeless tobacco: None  . Alcohol Use: Yes     Comment: socially   OB History    Gravida Para Term Preterm AB TAB SAB Ectopic Multiple Living   Review of Systems  Constitutional: Negative for fever, chills, activity change and fatigue.  HENT: Negative for congestion, sinus pressure and sore throat.   Eyes: Negative for visual disturbance.  Respiratory: Negative for cough, chest tightness and shortness of breath.   Cardiovascular: Negative for chest pain and leg swelling.  Gastrointestinal: Positive for abdominal pain (cramping). Negative  for nausea, vomiting and diarrhea.  Genitourinary: Negative for dysuria, frequency, flank pain and difficulty urinating.  Musculoskeletal: Negative for arthralgias and gait problem.  Skin: Negative for rash.  Neurological: Positive for headaches. Negative for weakness and numbness.  Hematological: Negative for adenopathy.  Psychiatric/Behavioral: Negative for behavioral problems, confusion and agitation.      Allergies  Review of patient's allergies indicates no known allergies.  Home Medications   Prior to Admission medications   Medication Sig Start Date End Date Taking? Authorizing Provider  ibuprofen (ADVIL,MOTRIN) 800 MG tablet Take 1 tablet (800 mg total) by mouth every 8 (eight) hours as needed. 02/06/15   Evon Slack, PA-C  phenazopyridine (PYRIDIUM) 95 MG tablet Take 95 mg by mouth 3 (three) times daily as needed for pain.    Historical Provider, MD  Phenazopyridine HCl & UTI Test (URISTAT UTI RELIEF PAK CO) by Combination route.    Historical Provider, MD  sulfamethoxazole-trimethoprim (BACTRIM DS,SEPTRA DS) 800-160 MG tablet Take 1 tablet by mouth 2 (two) times daily. 12/25/14   Darien Ramus, MD   BP 124/82 mmHg  Pulse 91  Temp(Src) 98.3 F (36.8 C) (Oral)  Resp 18  Ht  (1.651 m)  Wt 61.236 kg  BMI 22.47 kg/m2  SpO2 93%  LMP 01/14/2015 Physical Exam  Constitutional: She is oriented to person, place, and time. She appears well-developed and well-nourished. No distress.  HENT:  Head: Normocephalic and atraumatic.  Right Ear: External ear normal.  Left Ear: External ear normal.  Mouth/Throat: Oropharynx is clear  and moist.  Eyes: EOM are normal. Pupils are equal, round, and reactive to light. Right eye exhibits no discharge. Left eye exhibits no discharge.  Neck: Normal range of motion. Neck supple.  Cardiovascular: Normal rate, regular rhythm and intact distal pulses.   Pulmonary/Chest: Effort normal and breath sounds normal. No respiratory distress.  She exhibits no tenderness.  Abdominal: Soft. Bowel sounds are normal. She exhibits no distension and no mass. There is no tenderness. There is no rebound and no guarding.  No tenderness to palpation  Musculoskeletal: Normal range of motion. She exhibits no edema.  Lymphadenopathy:    She has no cervical adenopathy.  Neurological: She is alert and oriented to person, place, and time. She has normal reflexes. No cranial nerve deficit. Coordination normal.  Skin: Skin is warm and dry.  Psychiatric: She has a normal mood and affect. Her behavior is normal. Thought content normal.    ED Course  Procedures (including critical care time) Labs Review Labs Reviewed  URINALYSIS COMPLETEWITH MICROSCOPIC ([facility] ONLY) - Abnormal; Notable for the following:    Color, Urine YELLOW (*)    APPearance HAZY (*)    Hgb urine dipstick 3+ (*)    Protein, ur 100 (*)    Squamous Epithelial / LPF 0-5 (*)    All other components within normal limits  CBC WITH DIFFERENTIAL/PLATELET - Abnormal; Notable for the following:    RBC 3.72 (*)    Hemoglobin 10.9 (*)    HCT 32.6 (*)    All other components within normal limits  BASIC METABOLIC PANEL  POC URINE PREG, ED  POCT PREGNANCY, URINE    Imaging Review No results found. I have personally reviewed and evaluated these images and lab results as part of my medical decision-making.   EKG Interpretation None      MDM   Final diagnoses:  Acute intractable headache, unspecified headache type  Abdominal pain in female patient    29 year old female with headache and abdominal cramping. Patient given Toradol 60 mg IM, Benadryl 50 mg IM, Reglan 10 mg IM. Headache significantly improved at time of discharge. Abdominal cramping improved. No signs of acute abdomen on exam. Patient's labs and urine are normal. Patient will start ibuprofen 800 mg 1 tab by mouth 3 times a day when necessary headaches. She will follow-up with PCP or current total clinic in 3-4 days  if no improvement.    Evon Slackhomas C Gaines, PA-C 02/06/15 2235  Jennye MoccasinBrian S Quigley, MD 02/07/15 [phone]

## 2015-02-06 NOTE — ED Notes (Signed)
Pt presents to ED with c/o headache for about 1 1/2 weeks that she cant seem to get rid of. Pt reports she has not been taking over the counter medications to help with her pain. Pt also reports having nausea (vomiting X1), lower abd cramping, and lower back pain. Pt denies urinary symptoms. Pt alert and clam at this time with no increased work of breathing or acute distress noted.

## 2015-02-06 NOTE — ED Notes (Signed)
Pt here with multiple medical complaints. Pt states that she has had a headache for over a week and has been taking OTC meds. Pt is in NAD at this time will cont to monitor pt.

## 2015-04-02 ENCOUNTER — Emergency Department
Admission: EM | Admit: 2015-04-02 | Discharge: 2015-04-02 | Disposition: A | Discharge status: Home / Self Care | Payer: Medicaid Other | Attending: Emergency Medicine | Admitting: Emergency Medicine

## 2015-04-02 DIAGNOSIS — F17210 Nicotine dependence, cigarettes, uncomplicated: Secondary | ICD-10-CM | POA: Insufficient documentation

## 2015-04-02 DIAGNOSIS — Z3202 Encounter for pregnancy test, result negative: Secondary | ICD-10-CM | POA: Insufficient documentation

## 2015-04-02 DIAGNOSIS — N390 Urinary tract infection, site not specified: Secondary | ICD-10-CM

## 2015-04-02 DIAGNOSIS — R319 Hematuria, unspecified: Secondary | ICD-10-CM

## 2015-04-02 DIAGNOSIS — Z792 Long term (current) use of antibiotics: Secondary | ICD-10-CM | POA: Insufficient documentation

## 2015-04-02 LAB — URINALYSIS COMPLETE WITH MICROSCOPIC (ARMC ONLY)
Bilirubin Urine: NEGATIVE
Glucose, UA: NEGATIVE mg/dL
KETONES UR: NEGATIVE mg/dL
NITRITE: POSITIVE — AB
PH: 7 (ref 5.0–8.0)
PROTEIN: 30 mg/dL — AB
Specific Gravity, Urine: 1.013 (ref 1.005–1.030)

## 2015-04-02 LAB — POCT PREGNANCY, URINE: Preg Test, Ur: NEGATIVE

## 2015-04-02 MED ORDER — CEPHALEXIN 500 MG PO CAPS: 500.0000 mg | ORAL_CAPSULE | Freq: Two times a day (BID) | ORAL | Status: DC

## 2015-04-02 NOTE — ED Notes (Signed)
Patient reports that she feels like she has a uti. Patient reports lower abd pain, pain with urination and urinary frequency that started a few days ago.

## 2015-04-02 NOTE — ED Notes (Addendum)
Pt states that she thought it was time to start her cycle, pt states that she noticed some spotting when she went to the bathroom, states that she has had lower abd pain with lower back pain and now is having pain with urination, pt states that approx a month ago she had similar s/s and was diagnosed with a uti. Foul odor noted to urine sample

## 2015-04-02 NOTE — ED Provider Notes (Signed)
[facility] Emergency Department Provider Note  ____________________________________________  Time seen: Approximately 6:28 AM  I have reviewed the triage vital signs and the nursing notes.   HISTORY  Chief Complaint Dysuria    HPI Samantha [name] is a 30 y.o. female with no significant past medical history except for a urinary tract infection about a month ago who presents with several days of gradual onset dysuria and foul-smelling urine.  She states it is darker than usual and small similar to when she had an infection in the past.  Upon detailed history, she did acknowledge that the last time this happened was after being sexually active and she noticed the same thing this time.  She has not had any vaginal pain or discharge.  She describes the dysuria has a mild burning that occurs when she urinates.  She denies fever/chills, chest pain, shortness of breath, nausea, vomiting, diarrhea, abdominal pain.  Nothing makes her symptoms better and nothing makes it worse.   No past medical history on file.  There are no active problems to display for this patient.   No past surgical history on file.  Current Outpatient Rx  Name  Route  Sig  Dispense  Refill  . cephALEXin (KEFLEX) 500 MG capsule   Oral   Take 1 capsule (500 mg total) by mouth 2 (two) times daily.   14 capsule   0   . ibuprofen (ADVIL,MOTRIN) 800 MG tablet   Oral   Take 1 tablet (800 mg total) by mouth every 8 (eight) hours as needed.   30 tablet   0   . phenazopyridine (PYRIDIUM) 95 MG tablet   Oral   Take 95 mg by mouth 3 (three) times daily as needed for pain.         Marland Kitchen Phenazopyridine HCl & UTI Test (URISTAT UTI RELIEF PAK CO)   Combination   by Combination route.         . sulfamethoxazole-trimethoprim (BACTRIM DS,SEPTRA DS) 800-160 MG tablet   Oral   Take 1 tablet by mouth 2 (two) times daily.   11 tablet   0     Allergies Review of patient's allergies indicates no  known allergies.  No family history on file.  Social History Social History  Substance Use Topics  . Smoking status: Current Every Day Smoker -- 0.50 packs/day    Types: Cigarettes  . Smokeless tobacco: Not on file  . Alcohol Use: Yes     Comment: socially    Review of Systems Constitutional: No fever/chills Eyes: No visual changes. ENT: No sore throat. Cardiovascular: Denies chest pain. Respiratory: Denies shortness of breath. Gastrointestinal: No abdominal pain.  No nausea, no vomiting.  No diarrhea.  No constipation. Genitourinary: Mild burning dysuria and foul-smelling urine Musculoskeletal: Negative for back pain. Skin: Negative for rash. Neurological: Negative for headaches, focal weakness or numbness.  10-point ROS otherwise negative.  ____________________________________________   PHYSICAL EXAM:  VITAL SIGNS: ED Triage Vitals  Enc Vitals Group     BP 04/02/15 0235 115/81 mmHg     Pulse Rate 04/02/15 0235 79     Resp 04/02/15 0235 16     Temp 04/02/15 0235 98.6 F (37 C)     Temp Source 04/02/15 0235 Oral     SpO2 04/02/15 0235 100 %     Weight 04/02/15 0235 136 lb (61.689 kg)     Height 04/02/15 0235  (1.651 m)     Head Cir --  Peak Flow --      Pain Score 04/02/15 0236 3     Pain Loc --      Pain Edu? --      Excl. in GC? --     Constitutional: Alert and oriented. Well appearing and in no acute distress. Eyes: Conjunctivae are normal. PERRL. EOMI. Head: Atraumatic. Nose: No congestion/rhinnorhea. Mouth/Throat: Mucous membranes are moist.  Oropharynx non-erythematous. Neck: No stridor.   Cardiovascular: Normal rate, regular rhythm. Grossly normal heart sounds.  Good peripheral circulation. Respiratory: Normal respiratory effort.  No retractions. Lungs CTAB. Gastrointestinal: Soft and nontender. No distention. No abdominal bruits. No CVA tenderness. Genitourinary: Deferred at the patient's preference Musculoskeletal: No lower extremity  tenderness nor edema.  No joint effusions. Neurologic:  Normal speech and language. No gross focal neurologic deficits are appreciated.  Skin:  Skin is warm, dry and intact. No rash noted.   ____________________________________________   LABS (all labs ordered are listed, but only abnormal results are displayed)  Labs Reviewed  URINALYSIS COMPLETEWITH MICROSCOPIC ([facility] ONLY) - Abnormal; Notable for the following:    Color, Urine YELLOW (*)    APPearance CLOUDY (*)    Hgb urine dipstick 3+ (*)    Protein, ur 30 (*)    Nitrite POSITIVE (*)    Leukocytes, UA TRACE (*)    Bacteria, UA FEW (*)    Squamous Epithelial / LPF 0-5 (*)    All other components within normal limits  POC URINE PREG, ED  POCT PREGNANCY, URINE   ____________________________________________  EKG  None ____________________________________________  RADIOLOGY   No results found.  ____________________________________________   PROCEDURES  Procedure(s) performed: None  Critical Care performed: No ____________________________________________   INITIAL IMPRESSION / ASSESSMENT AND PLAN / ED COURSE  Pertinent labs & imaging results that were available during my care of the patient were reviewed by me and considered in my medical decision making (see chart for details).  The patient appears to have a mild urinary tract infection with hematuria.  She has no abdominal tenderness to palpation and no flank pain.  There is no indication for imaging.  I explained how sometimes women are prone to urinary tract infections particularly after intercourse.  I will treat her empirically but also send her urine to culture.  I recommended close outpatient follow-up.  I gave my usual and customary return precautions.    Of note, the patient is having no vaginal symptoms and declines a pelvic exam/swabs at this time.  I explained why it is recommended, but she prefers to treat the UTI empirically, and I understand and  agree with that plan.   ____________________________________________  FINAL CLINICAL IMPRESSION(S) / ED DIAGNOSES  Final diagnoses:  Urinary tract infection with hematuria, site unspecified      NEW MEDICATIONS STARTED DURING THIS VISIT:  New Prescriptions   CEPHALEXIN (KEFLEX) 500 MG CAPSULE    Take 1 capsule (500 mg total) by mouth 2 (two) times daily.     Loleta Rose, MD 04/02/15 [phone]

## 2015-04-02 NOTE — ED Notes (Signed)
MD at bedside. 

## 2015-04-02 NOTE — Discharge Instructions (Signed)
You have been seen in the Emergency Department (ED) today for pain when urinating.  Your workup today suggests that you have a urinary tract infection (UTI). ° °Please take your antibiotic as prescribed and over-the-counter pain medication (Tylenol or Motrin) as needed, but no more than recommended on the label instructions.  Drink PLENTY of fluids. ° °Call your regular doctor to schedule the next available appointment to follow up on today’s ED visit, or return immediately to the ED if your pain worsens, you have decreased urine production, develop fever, persistent vomiting, or other symptoms that concern you. ° ° °Urinary Tract Infection °Urinary tract infections (UTIs) can develop anywhere along your urinary tract. Your urinary tract is your body's drainage system for removing wastes and extra water. Your urinary tract includes two kidneys, two ureters, a bladder, and a urethra. Your kidneys are a pair of bean-shaped organs. Each kidney is about the size of your fist. They are located below your ribs, one on each side of your spine. °CAUSES °Infections are caused by microbes, which are microscopic organisms, including fungi, viruses, and bacteria. These organisms are so small that they can only be seen through a microscope. Bacteria are the microbes that most commonly cause UTIs. °SYMPTOMS  °Symptoms of UTIs may vary by age and gender of the patient and by the location of the infection. Symptoms in young women typically include a frequent and intense urge to urinate and a painful, burning feeling in the bladder or urethra during urination. Older women and men are more likely to be tired, shaky, and weak and have muscle aches and abdominal pain. A fever may mean the infection is in your kidneys. Other symptoms of a kidney infection include pain in your back or sides below the ribs, nausea, and vomiting. °DIAGNOSIS °To diagnose a UTI, your caregiver will ask you about your symptoms. Your caregiver will also ask  you to provide a urine sample. The urine sample will be tested for bacteria and white blood cells. White blood cells are made by your body to help fight infection. °TREATMENT  °Typically, UTIs can be treated with medication. Because most UTIs are caused by a bacterial infection, they usually can be treated with the use of antibiotics. The choice of antibiotic and length of treatment depend on your symptoms and the type of bacteria causing your infection. °HOME CARE INSTRUCTIONS °· If you were prescribed antibiotics, take them exactly as your caregiver instructs you. Finish the medication even if you feel better after you have only taken some of the medication. °· Drink enough water and fluids to keep your urine clear or pale yellow. °· Avoid caffeine, tea, and carbonated beverages. They tend to irritate your bladder. °· Empty your bladder often. Avoid holding urine for long periods of time. °· Empty your bladder before and after sexual intercourse. °· After a bowel movement, women should cleanse from front to back. Use each tissue only once. °SEEK MEDICAL CARE IF:  °· You have back pain. °· You develop a fever. °· Your symptoms do not begin to resolve within 3 days. °SEEK IMMEDIATE MEDICAL CARE IF:  °· You have severe back pain or lower abdominal pain. °· You develop chills. °· You have nausea or vomiting. °· You have continued burning or discomfort with urination. °MAKE SURE YOU:  °· Understand these instructions. °· Will watch your condition. °· Will get help right away if you are not doing well or get worse. °  °This information is not intended to   replace advice given to you by your health care provider. Make sure you discuss any questions you have with your health care provider. °  °Document Released: 11/06/2004 Document Revised: 10/18/2014 Document Reviewed: 03/07/2011 °Elsevier Interactive Patient Education ©2016 [employer] ° °

## 2015-04-04 LAB — URINE CULTURE
Culture: >=100000
Special Requests: NORMAL

## 2015-05-28 ENCOUNTER — Ambulatory Visit
Admission: EM | Admit: 2015-05-28 | Discharge: 2015-05-28 | Disposition: A | Discharge status: Home / Self Care | Payer: Medicaid Other | Attending: Family Medicine | Admitting: Family Medicine

## 2015-05-28 ENCOUNTER — Encounter: Payer: Self-pay | Admitting: Emergency Medicine

## 2015-05-28 DIAGNOSIS — N390 Urinary tract infection, site not specified: Secondary | ICD-10-CM | POA: Insufficient documentation

## 2015-05-28 DIAGNOSIS — F17210 Nicotine dependence, cigarettes, uncomplicated: Secondary | ICD-10-CM | POA: Insufficient documentation

## 2015-05-28 LAB — URINALYSIS COMPLETE WITH MICROSCOPIC (ARMC ONLY)
BILIRUBIN URINE: NEGATIVE
GLUCOSE, UA: NEGATIVE mg/dL
Ketones, ur: NEGATIVE mg/dL
Nitrite: NEGATIVE
Protein, ur: 30 mg/dL — AB
Specific Gravity, Urine: 1.01 (ref 1.005–1.030)
pH: 7 (ref 5.0–8.0)

## 2015-05-28 LAB — PREGNANCY, URINE: Preg Test, Ur: NEGATIVE

## 2015-05-28 MED ORDER — CIPROFLOXACIN HCL 500 MG PO TABS: 500.0000 mg | ORAL_TABLET | Freq: Two times a day (BID) | ORAL | Status: DC

## 2015-05-28 NOTE — ED Provider Notes (Signed)
CSN: [id number]     Arrival date & time 05/28/15  0845 History   None    Chief Complaint  Patient presents with  . Back Pain  . Fever   (Consider location/radiation/quality/duration/timing/severity/associated sxs/prior Treatment) Patient is a 30 y.o. female presenting with back pain, fever, and dysuria.  Back Pain Associated symptoms: dysuria and fever   Associated symptoms: no abdominal pain   Fever Associated symptoms: dysuria and vomiting (once yesterday)   Associated symptoms: no nausea   Dysuria Pain quality:  Aching Pain severity:  Mild Onset quality:  Sudden Duration:  2 days Timing:  Constant Progression:  Worsening Chronicity:  New Recent urinary tract infections: yes   Relieved by:  Nothing Urinary symptoms: discolored urine and hesitancy   Urinary symptoms: no foul-smelling urine, no frequent urination, no hematuria and no bladder incontinence   Associated symptoms: fever, flank pain and vomiting (once yesterday)   Associated symptoms: no abdominal pain, no genital lesions, no nausea and no vaginal discharge   Risk factors: recurrent urinary tract infections and sexually active   Risk factors: no hx of pyelonephritis, no hx of urolithiasis, no kidney transplant, not pregnant, no renal cysts, no renal disease, not single kidney, no sexually transmitted infections and no urinary catheter     History reviewed. No pertinent past medical history. History reviewed. No pertinent past surgical history. History reviewed. No pertinent family history. Social History  Substance Use Topics  . Smoking status: Current Every Day Smoker -- 0.50 packs/day    Types: Cigarettes  . Smokeless tobacco: None  . Alcohol Use: Yes     Comment: socially   OB History    Gravida Para Term Preterm AB TAB SAB Ectopic Multiple Living   Review of Systems  Constitutional: Positive for fever.  Gastrointestinal: Positive for vomiting (once yesterday). Negative for nausea  and abdominal pain.  Genitourinary: Positive for dysuria and flank pain. Negative for vaginal discharge.  Musculoskeletal: Positive for back pain.    Allergies  Review of patient's allergies indicates no known allergies.  Home Medications   Prior to Admission medications   Medication Sig Start Date End Date Taking? Authorizing Provider  cephALEXin (KEFLEX) 500 MG capsule Take 1 capsule (500 mg total) by mouth 2 (two) times daily. 04/02/15   Loleta Rose, MD  ciprofloxacin (CIPRO) 500 MG tablet Take 1 tablet (500 mg total) by mouth every 12 (twelve) hours. 05/28/15   Payton Mccallum, MD  ibuprofen (ADVIL,MOTRIN) 800 MG tablet Take 1 tablet (800 mg total) by mouth every 8 (eight) hours as needed. 02/06/15   Evon Slack, PA-C  phenazopyridine (PYRIDIUM) 95 MG tablet Take 95 mg by mouth 3 (three) times daily as needed for pain.    Historical Provider, MD  Phenazopyridine HCl & UTI Test (URISTAT UTI RELIEF PAK CO) by Combination route.    Historical Provider, MD  sulfamethoxazole-trimethoprim (BACTRIM DS,SEPTRA DS) 800-160 MG tablet Take 1 tablet by mouth 2 (two) times daily. 12/25/14   Darien Ramus, MD   Meds Ordered and Administered this Visit  Medications - No data to display  BP 99/68 mmHg  Pulse 79  Temp(Src) 98.1 F (36.7 C) (Tympanic)  Resp 16  Ht  (1.651 m)  Wt 140 lb (63.504 kg)  BMI 23.30 kg/m2  SpO2 100%  LMP 05/07/2015 (Approximate) No data found.   Physical Exam  Constitutional: She appears well-developed and well-nourished. No distress.  Abdominal: Soft. Bowel sounds are normal. She exhibits no distension and no mass. There is tenderness (mild suprapubic). There is no rebound and no guarding.  Skin: No rash noted. She is not diaphoretic.  Nursing note and vitals reviewed.   ED Course  Procedures (including critical care time)  Labs Review Labs Reviewed  URINALYSIS COMPLETEWITH MICROSCOPIC ([facility] ONLY) - Abnormal; Notable for the following:    Color,  Urine STRAW (*)    APPearance CLOUDY (*)    Hgb urine dipstick 3+ (*)    Protein, ur 30 (*)    Leukocytes, UA 2+ (*)    Bacteria, UA FEW (*)    Squamous Epithelial / LPF 6-30 (*)    All other components within normal limits  URINE CULTURE  PREGNANCY, URINE    Imaging Review No results found.   Visual Acuity Review  Right Eye Distance:   Left Eye Distance:   Bilateral Distance:    Right Eye Near:   Left Eye Near:    Bilateral Near:         MDM   1. UTI (lower urinary tract infection)    Discharge Medication List as of 05/28/2015 10:07 AM    START taking these medications   Details  ciprofloxacin (CIPRO) 500 MG tablet Take 1 tablet (500 mg total) by mouth every 12 (twelve) hours., Starting 05/28/2015, Until Discontinued, Normal       1. Lab results and diagnosis reviewed with patient 2. rx as per orders above; reviewed possible side effects, interactions, risks and benefits  3. Recommend supportive treatment with increased water intake 4. Follow-up prn if symptoms worsen or don't improve   Payton Mccallumrlando Takenya Travaglini, MD 05/28/15 1049

## 2015-05-28 NOTE — ED Notes (Signed)
Patient c/o fever, chills, and lower back pain for the past 2 days.  Patient also reports headaches.  Patient denies N/V/D.

## 2015-05-30 LAB — URINE CULTURE: Culture: >=100000 — AB

## 2016-03-19 ENCOUNTER — Encounter: Payer: Self-pay | Admitting: Emergency Medicine

## 2016-03-19 DIAGNOSIS — R55 Syncope and collapse: Secondary | ICD-10-CM | POA: Insufficient documentation

## 2016-03-19 DIAGNOSIS — F17210 Nicotine dependence, cigarettes, uncomplicated: Secondary | ICD-10-CM | POA: Diagnosis not present

## 2016-03-19 LAB — BASIC METABOLIC PANEL
Anion gap: 4 — ABNORMAL LOW (ref 5–15)
BUN: 9 mg/dL (ref 6–20)
CALCIUM: 8.5 mg/dL — AB (ref 8.9–10.3)
CO2: 23 mmol/L (ref 22–32)
Chloride: 111 mmol/L (ref 101–111)
Creatinine, Ser: 0.68 mg/dL (ref 0.44–1.00)
GFR calc Af Amer: >60 mL/min (ref >60)
GFR calc non Af Amer: >60 mL/min (ref >60)
GLUCOSE: 101 mg/dL — AB (ref 65–99)
Potassium: 3.5 mmol/L (ref 3.5–5.1)
Sodium: 138 mmol/L (ref 135–145)

## 2016-03-19 LAB — CBC
HEMATOCRIT: 33.5 % — AB (ref 35.0–47.0)
Hemoglobin: 11.7 g/dL — ABNORMAL LOW (ref 12.0–16.0)
MCH: 30.2 pg (ref 26.0–34.0)
MCHC: 34.8 g/dL (ref 32.0–36.0)
MCV: 87 fL (ref 80.0–100.0)
Platelets: 286 10*3/uL (ref 150–440)
RBC: 3.86 MIL/uL (ref 3.80–5.20)
RDW: 14.3 % (ref 11.5–14.5)
WBC: 4.4 10*3/uL (ref 3.6–11.0)

## 2016-03-19 LAB — URINALYSIS, COMPLETE (UACMP) WITH MICROSCOPIC
Bilirubin Urine: NEGATIVE
Glucose, UA: NEGATIVE mg/dL
Ketones, ur: NEGATIVE mg/dL
Leukocytes, UA: NEGATIVE
NITRITE: NEGATIVE
PH: 6 (ref 5.0–8.0)
Protein, ur: NEGATIVE mg/dL
SPECIFIC GRAVITY, URINE: 1.002 — AB (ref 1.005–1.030)

## 2016-03-19 LAB — POCT PREGNANCY, URINE: Preg Test, Ur: NEGATIVE

## 2016-03-19 NOTE — ED Triage Notes (Signed)
Pt to triage via EMS from work, report near syncopal episode, NS given, VSS, BG 89.  Pt reports hx of same and last time was attributed to anxiety.  Pt reports recent stress.

## 2016-03-20 ENCOUNTER — Emergency Department
Admission: EM | Admit: 2016-03-20 | Discharge: 2016-03-20 | Disposition: A | Discharge status: Home / Self Care | Payer: BLUE CROSS/BLUE SHIELD | Attending: Emergency Medicine | Admitting: Emergency Medicine

## 2016-03-20 DIAGNOSIS — R55 Syncope and collapse: Secondary | ICD-10-CM

## 2016-03-20 HISTORY — DX: Depression, unspecified: F32.A

## 2016-03-20 HISTORY — DX: Anxiety disorder, unspecified: F41.9

## 2016-03-20 HISTORY — DX: Major depressive disorder, single episode, unspecified: F32.9

## 2016-03-20 MED ORDER — SODIUM CHLORIDE 0.9 % IV BOLUS (SEPSIS)
250.0000 mL | Freq: Once | INTRAVENOUS | Status: AC
  Administered 2016-03-20: 250 mL via INTRAVENOUS

## 2016-03-20 NOTE — Discharge Instructions (Signed)
Please seek medical attention for any high fevers, chest pain, shortness of breath, change in behavior, persistent vomiting, bloody stool or any other new or concerning symptoms.  

## 2016-03-20 NOTE — ED Provider Notes (Signed)
[facility] Emergency Department Provider Note   ____________________________________________   I have reviewed the triage vital signs and the nursing notes.   HISTORY  Chief Complaint Near Syncope   History limited by: Not Limited   HPI Samantha [name] is a 31 y.o. female who presents to the emergency department today because of concerns for a near syncopal episode. The patient states she was at work when she started feeling lightheaded. Her legs then went out from under her. She states that during work she is standing for prolonged periods of time. The patient denied any chest pain or palpitations during this time. She denies true loss of consciousness. The patient states this is happened to her in the past usually when she is under a lot of stress. She does feel she has been under a lot more stress the past week. She denies any recent illness or fevers.   Past Medical History:  Diagnosis Date  . Anxiety   . Depression     There are no active problems to display for this patient.   History reviewed. No pertinent surgical history.  Prior to Admission medications   Medication Sig Start Date End Date Taking? Authorizing Provider  cephALEXin (KEFLEX) 500 MG capsule Take 1 capsule (500 mg total) by mouth 2 (two) times daily. 04/02/15   Loleta Rose, MD  ciprofloxacin (CIPRO) 500 MG tablet Take 1 tablet (500 mg total) by mouth every 12 (twelve) hours. 05/28/15   Payton Mccallum, MD  ibuprofen (ADVIL,MOTRIN) 800 MG tablet Take 1 tablet (800 mg total) by mouth every 8 (eight) hours as needed. 02/06/15   Evon Slack, PA-C  phenazopyridine (PYRIDIUM) 95 MG tablet Take 95 mg by mouth 3 (three) times daily as needed for pain.    Historical Provider, MD  Phenazopyridine HCl & UTI Test (URISTAT UTI RELIEF PAK CO) by Combination route.    Historical Provider, MD  sulfamethoxazole-trimethoprim (BACTRIM DS,SEPTRA DS) 800-160 MG tablet Take 1 tablet by mouth 2 (two)  times daily. 12/25/14   Darien Ramus, MD    Allergies Patient has no known allergies.  History reviewed. No pertinent family history.  Social History Social History  Substance Use Topics  . Smoking status: Current Every Day Smoker    Packs/day: 0.50    Types: Cigarettes  . Smokeless tobacco: Never Used  . Alcohol use Yes     Comment: socially    Review of Systems  Constitutional: Negative for fever. Cardiovascular: Negative for chest pain. Respiratory: Negative for shortness of breath. Gastrointestinal: Negative for abdominal pain, vomiting and diarrhea. Neurological: Negative for headaches, focal weakness or numbness.  10-point ROS otherwise negative.  ____________________________________________   PHYSICAL EXAM:  VITAL SIGNS: ED Triage Vitals [03/19/16 2144]  Enc Vitals Group     BP 115/89     Pulse Rate 63     Resp 16     Temp 98.7 F (37.1 C)     Temp Source Oral     SpO2 100 %     Weight 140 lb (63.5 kg)     Height 5\' 5"  (1.651 m)     Head Circumference      Peak Flow      Pain Score 7   Constitutional: Alert and oriented. Well appearing and in no distress. Eyes: Conjunctivae are normal. Normal extraocular movements. ENT   Head: Normocephalic and atraumatic.   Nose: No congestion/rhinnorhea.   Mouth/Throat: Mucous membranes are moist.   Neck: No stridor. Hematological/Lymphatic/Immunilogical:  No cervical lymphadenopathy. Cardiovascular: Normal rate, regular rhythm.  No murmurs, rubs, or gallops.  Respiratory: Normal respiratory effort without tachypnea nor retractions. Breath sounds are clear and equal bilaterally. No wheezes/rales/rhonchi. Gastrointestinal: Soft and non tender. No rebound. No guarding.  Genitourinary: Deferred Musculoskeletal: Normal range of motion in all extremities. No lower extremity edema. Neurologic:  Normal speech and language. No gross focal neurologic deficits are appreciated.  Skin:  Skin is warm, dry and  intact. No rash noted. Psychiatric: Mood and affect are normal. Speech and behavior are normal. Patient exhibits appropriate insight and judgment.  ____________________________________________    LABS (pertinent positives/negatives)  Labs Reviewed  BASIC METABOLIC PANEL - Abnormal; Notable for the following:       Result Value   Glucose, Bld 101 (*)    Calcium 8.5 (*)    Anion gap 4 (*)    All other components within normal limits  CBC - Abnormal; Notable for the following:    Hemoglobin 11.7 (*)    HCT 33.5 (*)    All other components within normal limits  URINALYSIS, COMPLETE (UACMP) WITH MICROSCOPIC - Abnormal; Notable for the following:    Color, Urine STRAW (*)    APPearance CLEAR (*)    Specific Gravity, Urine 1.002 (*)    Hgb urine dipstick LARGE (*)    Bacteria, UA RARE (*)    Squamous Epithelial / LPF 0-5 (*)    All other components within normal limits  CBG MONITORING, ED  POCT PREGNANCY, URINE     ____________________________________________   EKG  I, Phineas SemenGraydon Daphna Lafuente, attending physician, personally viewed and interpreted this EKG  EKG Time: 2144 Rate: 64 Rhythm: normal sinus rhythm Axis: normal Intervals: qtc 418 QRS: narrow ST changes: no st elevation Impression: normal ekg  ____________________________________________    RADIOLOGY  None  ____________________________________________   PROCEDURES  Procedures  ____________________________________________   INITIAL IMPRESSION / ASSESSMENT AND PLAN / ED COURSE  Pertinent labs & imaging results that were available during my care of the patient were reviewed by me and considered in my medical decision making (see chart for details).  Patient presented to the emergency department today with a near syncopal episode. Workup here in the emergency department without concerning finding. Will plan on giving patient some IV fluids and discharging  home.  ____________________________________________   FINAL CLINICAL IMPRESSION(S) / ED DIAGNOSES  Final diagnoses:  Near syncope     Note: This dictation was prepared with Dragon dictation. Any transcriptional errors that result from this process are unintentional     Phineas SemenGraydon Shandi Godfrey, MD 03/20/16 [phone]

## 2016-03-20 NOTE — ED Notes (Signed)
Pt discharged to home.  Family member driving.  Discharge instructions reviewed.  Verbalized understanding.  No questions or concerns at this time.  Teach back verified.  Pt in NAD.  No items left in ED.   

## 2016-06-20 ENCOUNTER — Ambulatory Visit (INDEPENDENT_AMBULATORY_CARE_PROVIDER_SITE_OTHER): Payer: BLUE CROSS/BLUE SHIELD

## 2016-06-20 DIAGNOSIS — Z3042 Encounter for surveillance of injectable contraceptive: Secondary | ICD-10-CM

## 2016-06-20 MED ORDER — MEDROXYPROGESTERONE ACETATE 150 MG/ML IM SUSP
150.0000 mg | Freq: Once | INTRAMUSCULAR | Status: AC
  Administered 2016-06-20: 150 mg via INTRAMUSCULAR

## 2016-09-10 ENCOUNTER — Other Ambulatory Visit: Payer: Self-pay

## 2016-09-10 ENCOUNTER — Other Ambulatory Visit: Payer: Self-pay | Admitting: Obstetrics and Gynecology

## 2016-09-10 ENCOUNTER — Ambulatory Visit: Payer: BLUE CROSS/BLUE SHIELD

## 2016-09-10 DIAGNOSIS — Z3042 Encounter for surveillance of injectable contraceptive: Secondary | ICD-10-CM

## 2016-09-10 MED ORDER — MEDROXYPROGESTERONE ACETATE 150 MG/ML IM SUSY: 150.0000 mg | PREFILLED_SYRINGE | Freq: Once | INTRAMUSCULAR | 3 refills | Status: DC

## 2016-09-10 NOTE — Telephone Encounter (Signed)
Pt is schedule 10/08/16 with Helmut MusterAlicia and is request refill on Depo injection to [facility] [place].

## 2016-09-17 ENCOUNTER — Ambulatory Visit: Payer: BLUE CROSS/BLUE SHIELD

## 2016-09-18 ENCOUNTER — Ambulatory Visit (INDEPENDENT_AMBULATORY_CARE_PROVIDER_SITE_OTHER): Payer: BLUE CROSS/BLUE SHIELD

## 2016-09-18 DIAGNOSIS — Z3042 Encounter for surveillance of injectable contraceptive: Secondary | ICD-10-CM

## 2016-09-18 MED ORDER — MEDROXYPROGESTERONE ACETATE 150 MG/ML IM SUSP
150.0000 mg | Freq: Once | INTRAMUSCULAR | Status: AC
  Administered 2016-09-18: 150 mg via INTRAMUSCULAR

## 2016-10-08 ENCOUNTER — Encounter: Payer: Self-pay | Admitting: Obstetrics and Gynecology

## 2016-10-08 ENCOUNTER — Ambulatory Visit (INDEPENDENT_AMBULATORY_CARE_PROVIDER_SITE_OTHER): Payer: BLUE CROSS/BLUE SHIELD | Admitting: Obstetrics and Gynecology

## 2016-10-08 VITALS — BP 118/76 | Ht 65.0 in | Wt 161.0 lb

## 2016-10-08 DIAGNOSIS — Z3042 Encounter for surveillance of injectable contraceptive: Secondary | ICD-10-CM

## 2016-10-08 DIAGNOSIS — Z01419 Encounter for gynecological examination (general) (routine) without abnormal findings: Secondary | ICD-10-CM

## 2016-10-08 DIAGNOSIS — Z124 Encounter for screening for malignant neoplasm of cervix: Secondary | ICD-10-CM

## 2016-10-08 MED ORDER — MEDROXYPROGESTERONE ACETATE 150 MG/ML IM SUSY: 150.0000 mg | PREFILLED_SYRINGE | Freq: Once | INTRAMUSCULAR | 3 refills | Status: AC

## 2016-10-08 NOTE — Progress Notes (Signed)
PCP:  Patient, No Pcp Per   Chief Complaint  Patient presents with  . Annual Exam     HPI:      [name] is a 31 y.o. G2P0011 who LMP was No LMP recorded. Patient has had an injection., presents today for her annual examination.  Her menses are absent due to depo. Dysmenorrhea mild, occurring throughout menses. She does have intermenstrual bleeding.  Sex activity: single partner, contraception - Depo-Provera injections.  Last Pap: August 30, 2015  Results were: no abnormalities  Hx of STDs: chlamydia  There is no FH of breast cancer. There is no FH of ovarian cancer. The patient does do self-breast exams.  Tobacco use: The patient currently smokes 1/2 packs of cigarettes per day for the past several years.  Will quit when she gets pregnant.  Alcohol use: none No drug use.  Exercise: moderately active  She does get adequate calcium and Vitamin D in her diet.  Since her last annual GYN exam, she has not had any significant changes in her health history.    Past Medical History:  Diagnosis Date  . Anxiety   . Depression     History reviewed. No pertinent surgical history.  History reviewed. No pertinent family history.  Social History   Social History  . Marital status: Single    Spouse name: N/A  . Number of children: N/A  . Years of education: N/A   Occupational History  . Not on file.   Social History Main Topics  . Smoking status: Current Every Day Smoker    Packs/day: 0.50    Types: Cigarettes  . Smokeless tobacco: Never Used  . Alcohol use Yes     Comment: socially  . Drug use: No  . Sexual activity: Yes   Other Topics Concern  . Not on file   Social History Narrative  . No narrative on file    No outpatient prescriptions have been marked as taking for the 10/08/16 encounter (Office Visit) with Porchia Sinkler, Ilona Sorrel, PA-C.     ROS:  Review of Systems  Constitutional: Negative for fatigue, fever and unexpected weight change.    Respiratory: Negative for cough, shortness of breath and wheezing.   Cardiovascular: Negative for chest pain, palpitations and leg swelling.  Gastrointestinal: Negative for blood in stool, constipation, diarrhea, nausea and vomiting.  Endocrine: Negative for cold intolerance, heat intolerance and polyuria.  Genitourinary: Negative for dyspareunia, dysuria, flank pain, frequency, genital sores, hematuria, menstrual problem, pelvic pain, urgency, vaginal bleeding, vaginal discharge and vaginal pain.  Musculoskeletal: Negative for back pain, joint swelling and myalgias.  Skin: Negative for rash.  Neurological: Negative for dizziness, syncope, light-headedness, numbness and headaches.  Hematological: Negative for adenopathy.  Psychiatric/Behavioral: Negative for agitation, confusion, sleep disturbance and suicidal ideas. The patient is not nervous/anxious.      Objective: BP 118/76   Ht 5\' 5"  (1.651 m)   Wt 161 lb (73 kg)   BMI 26.79 kg/m    Physical Exam  Constitutional: She is oriented to person, place, and time. She appears well-developed and well-nourished.  Genitourinary: Vagina normal and uterus normal. There is no rash or tenderness on the right labia. There is no rash or tenderness on the left labia. No erythema or tenderness in the vagina. No vaginal discharge found. Right adnexum does not display mass and does not display tenderness. Left adnexum does not display mass and does not display tenderness. Cervix does not exhibit motion tenderness or polyp.  Uterus is not enlarged or tender.  Neck: Normal range of motion. No thyromegaly present.  Cardiovascular: Normal rate, regular rhythm and normal heart sounds.   No murmur heard. Pulmonary/Chest: Effort normal and breath sounds normal. Right breast exhibits no mass, no nipple discharge, no skin change and no tenderness. Left breast exhibits no mass, no nipple discharge, no skin change and no tenderness.  Abdominal: Soft. There is no  tenderness. There is no guarding.  Musculoskeletal: Normal range of motion.  Neurological: She is alert and oriented to person, place, and time. No cranial nerve deficit.  Psychiatric: She has a normal mood and affect. Her behavior is normal.  Vitals reviewed.   Assessment/Plan: Encounter for annual routine gynecological examination  Cervical cancer screening - Plan: IGP, rfx Aptima HPV ASCU  Encounter for surveillance of injectable contraceptive - Depo RF eRxd. Cont calcium.   Surveillance for Depo-Provera contraception - Plan: MedroxyPROGESTERone Acetate 150 MG/ML SUSY  Meds ordered this encounter  Medications  . MedroxyPROGESTERone Acetate 150 MG/ML SUSY    Sig: Inject 1 mL (150 mg total) as directed once.    Dispense:  1 Syringe    Refill:  3             GYN counsel adequate intake of calcium and vitamin D     F/U  Return in about 1 year (around 10/08/2017).  Perri Lamagna B. Pricsilla Lindvall, PA-C 10/08/2016 11:11 AM

## 2016-10-10 LAB — IGP, RFX APTIMA HPV ASCU: PAP Smear Comment: 0

## 2016-12-11 ENCOUNTER — Ambulatory Visit (INDEPENDENT_AMBULATORY_CARE_PROVIDER_SITE_OTHER): Payer: BLUE CROSS/BLUE SHIELD

## 2016-12-11 DIAGNOSIS — Z309 Encounter for contraceptive management, unspecified: Secondary | ICD-10-CM

## 2016-12-11 MED ORDER — MEDROXYPROGESTERONE ACETATE 150 MG/ML IM SUSP
150.0000 mg | Freq: Once | INTRAMUSCULAR | Status: AC
  Administered 2016-12-11: 150 mg via INTRAMUSCULAR

## 2017-03-05 ENCOUNTER — Ambulatory Visit (INDEPENDENT_AMBULATORY_CARE_PROVIDER_SITE_OTHER): Payer: BLUE CROSS/BLUE SHIELD

## 2017-03-05 DIAGNOSIS — Z308 Encounter for other contraceptive management: Secondary | ICD-10-CM | POA: Diagnosis not present

## 2017-03-05 MED ORDER — MEDROXYPROGESTERONE ACETATE 150 MG/ML IM SUSP
150.0000 mg | Freq: Once | INTRAMUSCULAR | Status: AC
  Administered 2017-03-05: 150 mg via INTRAMUSCULAR

## 2017-03-05 NOTE — Progress Notes (Signed)
Pt here for depo which was given IM left deltoid. NDC# [address]-2 

## 2017-05-28 ENCOUNTER — Ambulatory Visit (INDEPENDENT_AMBULATORY_CARE_PROVIDER_SITE_OTHER): Payer: Self-pay

## 2017-05-28 DIAGNOSIS — Z309 Encounter for contraceptive management, unspecified: Secondary | ICD-10-CM

## 2017-05-28 MED ORDER — MEDROXYPROGESTERONE ACETATE 150 MG/ML IM SUSP
150.0000 mg | Freq: Once | INTRAMUSCULAR | Status: AC
  Administered 2017-05-28: 150 mg via INTRAMUSCULAR

## 2017-08-20 ENCOUNTER — Ambulatory Visit (INDEPENDENT_AMBULATORY_CARE_PROVIDER_SITE_OTHER): Payer: BLUE CROSS/BLUE SHIELD

## 2017-08-20 DIAGNOSIS — Z3042 Encounter for surveillance of injectable contraceptive: Secondary | ICD-10-CM

## 2017-08-20 DIAGNOSIS — Z309 Encounter for contraceptive management, unspecified: Secondary | ICD-10-CM

## 2017-08-20 MED ORDER — MEDROXYPROGESTERONE ACETATE 150 MG/ML IM SUSP
150.0000 mg | Freq: Once | INTRAMUSCULAR | Status: AC
  Administered 2017-08-20: 150 mg via INTRAMUSCULAR

## 2017-08-20 NOTE — Progress Notes (Signed)
Pt here for Depo injection. Gave 150mg  of Medroxyprogesterone Acetate in Left Deltoid. Patient tolerated well. Band-Aid applied.

## 2017-09-02 DIAGNOSIS — G44209 Tension-type headache, unspecified, not intractable: Secondary | ICD-10-CM | POA: Insufficient documentation

## 2017-09-02 DIAGNOSIS — Z0000 Encounter for general adult medical examination without abnormal findings: Secondary | ICD-10-CM | POA: Insufficient documentation

## 2017-09-23 DIAGNOSIS — F419 Anxiety disorder, unspecified: Secondary | ICD-10-CM | POA: Insufficient documentation

## 2017-10-13 ENCOUNTER — Ambulatory Visit: Payer: BLUE CROSS/BLUE SHIELD | Admitting: Obstetrics and Gynecology

## 2017-11-12 ENCOUNTER — Ambulatory Visit: Payer: BLUE CROSS/BLUE SHIELD

## 2019-03-28 ENCOUNTER — Other Ambulatory Visit: Payer: Self-pay

## 2019-03-28 ENCOUNTER — Ambulatory Visit
Admission: EM | Admit: 2019-03-28 | Discharge: 2019-03-28 | Disposition: A | Discharge status: Home / Self Care | Payer: 59 | Attending: Family Medicine | Admitting: Family Medicine

## 2019-03-28 ENCOUNTER — Encounter: Payer: Self-pay | Admitting: Emergency Medicine

## 2019-03-28 DIAGNOSIS — K644 Residual hemorrhoidal skin tags: Secondary | ICD-10-CM

## 2019-03-28 MED ORDER — HYDROCORTISONE (PERIANAL) 2.5 % EX CREA: 1.0000 "application " | TOPICAL_CREAM | Freq: Three times a day (TID) | CUTANEOUS | 0 refills | Status: DC

## 2019-03-28 MED ORDER — HYDROCORTISONE ACETATE 25 MG RE SUPP: 25.0000 mg | Freq: Two times a day (BID) | RECTAL | 0 refills | Status: DC

## 2019-03-28 NOTE — ED Triage Notes (Signed)
Pt c/o hemorrhoids started about a week and a half ago. She states that she has had hemorrhoids before but never this bad. She has tried otc creams, baths without relief. She states that she can lay in bed ans feel sharp pains. The hemorrhoid has also been bleeding. Pt is noticeably uncomfortable when sitting.

## 2019-03-28 NOTE — Discharge Instructions (Signed)
Continue epsom salt baths, TUCKS wipes

## 2019-04-02 NOTE — ED Provider Notes (Signed)
[facility]    CSN: [id number] Arrival date & time: 03/28/19  6578      History   Chief Complaint Chief Complaint  Patient presents with  . Hemorrhoids    HPI Samantha [name] is a 34 y.o. female.   34 yo female with a c/o painful and bleeding hemorrhoid for the past week. States she's tried over the counter Preparation H without any relief.      Past Medical History:  Diagnosis Date  . Anxiety   . Depression     There are no problems to display for this patient.   Past Surgical History:  Procedure Laterality Date  . NO PAST SURGERIES      OB History    Gravida  2   Para      Term      Preterm      AB  1   Living  1     SAB      TAB      Ectopic      Multiple      Live Births               Home Medications    Prior to Admission medications   Medication Sig Start Date End Date Taking? Authorizing Provider  hydrochlorothiazide (HYDRODIURIL) 12.5 MG tablet Take 12.5 mg by mouth daily. 03/20/19  Yes [provider]  cephALEXin (KEFLEX) 500 MG capsule Take 1 capsule (500 mg total) by mouth 2 (two) times daily. Patient not taking: Reported on 10/08/2016 04/02/15   Loleta Rose, MD  ciprofloxacin (CIPRO) 500 MG tablet Take 1 tablet (500 mg total) by mouth every 12 (twelve) hours. Patient not taking: Reported on 10/08/2016 05/28/15   Payton Mccallum, MD  hydrocortisone (ANUSOL-HC) 2.5 % rectal cream Place 1 application rectally 3 (three) times daily. 03/28/19   Payton Mccallum, MD  hydrocortisone (ANUSOL-HC) 25 MG suppository Place 1 suppository (25 mg total) rectally 2 (two) times daily. 03/28/19   Payton Mccallum, MD  ibuprofen (ADVIL,MOTRIN) 800 MG tablet Take 1 tablet (800 mg total) by mouth every 8 (eight) hours as needed. Patient not taking: Reported on 10/08/2016 02/06/15   Evon Slack, PA-C  MedroxyPROGESTERone Acetate 150 MG/ML SUSY Inject 1 mL (150 mg total) as directed once. 10/08/16 10/08/16  Copland, Ilona Sorrel, PA-C    phenazopyridine (PYRIDIUM) 95 MG tablet Take 95 mg by mouth 3 (three) times daily as needed for pain.    [provider]  Phenazopyridine HCl & UTI Test (URISTAT UTI RELIEF PAK CO) by Combination route.    [provider]  sulfamethoxazole-trimethoprim (BACTRIM DS,SEPTRA DS) 800-160 MG tablet Take 1 tablet by mouth 2 (two) times daily. Patient not taking: Reported on 10/08/2016 12/25/14   Darien Ramus, MD    Family History Family History  Problem Relation Age of Onset  . Healthy Mother     Social History Social History   Tobacco Use  . Smoking status: Former Smoker    Packs/day: 0.50    Types: Cigarettes    Quit date: 07/2018    Years since quitting: 0.7  . Smokeless tobacco: Never Used  Substance Use Topics  . Alcohol use: Yes    Comment: socially  . Drug use: No     Allergies   Patient has no known allergies.   Review of Systems Review of Systems   Physical Exam Triage Vital Signs ED Triage Vitals  Enc Vitals Group     BP 03/28/19  1015 (!) 131/104     Pulse Rate 03/28/19 1015 95     Resp 03/28/19 1015 18     Temp 03/28/19 1015 99.1 F (37.3 C)     Temp Source 03/28/19 1015 Oral     SpO2 03/28/19 1015 98 %     Weight 03/28/19 1011 178 lb (80.7 kg)     Height 03/28/19 1011 5\' 5"  (1.651 m)     Head Circumference --      Peak Flow --      Pain Score 03/28/19 1011 7     Pain Loc --      Pain Edu? --      Excl. in [place]? --    No data found.  Updated Vital Signs BP (!) 131/104 (BP Location: Left Arm)   Pulse 95   Temp 99.1 F (37.3 C) (Oral)   Resp 18   Ht 5\' 5"  (1.651 m)   Wt 80.7 kg   SpO2 98%   BMI 29.62 kg/m   Visual Acuity Right Eye Distance:   Left Eye Distance:   Bilateral Distance:    Right Eye Near:   Left Eye Near:    Bilateral Near:     Physical Exam Vitals and nursing note reviewed. Exam conducted with a chaperone present.  Constitutional:      General: She is not in acute distress.    Appearance: She is  not toxic-appearing or diaphoretic.  Genitourinary:    Exam position: Knee-chest position.     Rectum: External hemorrhoid present.  Neurological:     Mental Status: She is alert.      UC Treatments / Results  Labs (all labs ordered are listed, but only abnormal results are displayed) Labs Reviewed - No data to display  EKG   Radiology No results found.  Procedures Procedures (including critical care time)  Medications Ordered in UC Medications - No data to display  Initial Impression / Assessment and Plan / UC Course  I have reviewed the triage vital signs and the nursing notes.  Pertinent labs & imaging results that were available during my care of the patient were reviewed by me and considered in my medical decision making (see chart for details).      Final Clinical Impressions(s) / UC Diagnoses   Final diagnoses:  Inflamed external hemorrhoid     Discharge Instructions     Continue epsom salt baths, TUCKS wipes     ED Prescriptions    Medication Sig Dispense Auth. Provider   hydrocortisone (ANUSOL-HC) 25 MG suppository Place 1 suppository (25 mg total) rectally 2 (two) times daily. 12 suppository [place], MD   hydrocortisone (ANUSOL-HC) 2.5 % rectal cream Place 1 application rectally 3 (three) times daily. 57 g Norval Gable, MD      1. diagnosis reviewed with patient 2. rx as per orders above; reviewed possible side effects, interactions, risks and benefits  3. Recommend supportive treatment as above 4. Follow-up prn if symptoms worsen or don't improve  PDMP not reviewed this encounter.   Norval Gable, MD 04/02/19 1229

## 2019-12-21 DIAGNOSIS — K649 Unspecified hemorrhoids: Secondary | ICD-10-CM | POA: Insufficient documentation

## 2019-12-21 DIAGNOSIS — G43909 Migraine, unspecified, not intractable, without status migrainosus: Secondary | ICD-10-CM | POA: Insufficient documentation

## 2019-12-21 DIAGNOSIS — F32A Depression, unspecified: Secondary | ICD-10-CM | POA: Insufficient documentation

## 2019-12-22 DIAGNOSIS — R7303 Prediabetes: Secondary | ICD-10-CM | POA: Insufficient documentation

## 2019-12-30 DIAGNOSIS — F419 Anxiety disorder, unspecified: Secondary | ICD-10-CM | POA: Insufficient documentation

## 2019-12-30 DIAGNOSIS — I10 Essential (primary) hypertension: Secondary | ICD-10-CM | POA: Insufficient documentation

## 2020-02-14 DIAGNOSIS — Z1331 Encounter for screening for depression: Secondary | ICD-10-CM | POA: Diagnosis not present

## 2020-02-14 DIAGNOSIS — N898 Other specified noninflammatory disorders of vagina: Secondary | ICD-10-CM | POA: Diagnosis not present

## 2020-02-14 DIAGNOSIS — N760 Acute vaginitis: Secondary | ICD-10-CM | POA: Diagnosis not present

## 2020-02-14 DIAGNOSIS — Z3009 Encounter for other general counseling and advice on contraception: Secondary | ICD-10-CM | POA: Diagnosis not present

## 2020-02-14 DIAGNOSIS — Z01419 Encounter for gynecological examination (general) (routine) without abnormal findings: Secondary | ICD-10-CM | POA: Diagnosis not present

## 2020-02-15 ENCOUNTER — Encounter: Payer: Self-pay | Admitting: Emergency Medicine

## 2020-02-15 ENCOUNTER — Other Ambulatory Visit: Payer: Self-pay

## 2020-02-15 ENCOUNTER — Ambulatory Visit
Admission: EM | Admit: 2020-02-15 | Discharge: 2020-02-15 | Disposition: A | Discharge status: Home / Self Care | Payer: BC Managed Care – PPO

## 2020-02-15 DIAGNOSIS — T783XXA Angioneurotic edema, initial encounter: Secondary | ICD-10-CM

## 2020-02-15 MED ORDER — PREDNISONE 10 MG (21) PO TBPK
ORAL_TABLET | Freq: Every day | ORAL | 0 refills | Status: DC
Start: 2020-02-15 — End: 2020-09-04

## 2020-02-15 NOTE — Discharge Instructions (Addendum)
Take the prednisone according to the package instructions.  Take over-the-counter Pepcid 20 mg twice daily.  Take over-the-counter Allegra 180 mg daily.  You can take Benadryl 50 mg at bedtime.  If you develop worsening swelling of your lip that involves swelling of your tongue, difficulty breathing, or difficulty swallowing you need to call 911 or go to the ER.

## 2020-02-15 NOTE — ED Provider Notes (Signed)
[facility]    CSN: [id number] Arrival date & time: 02/15/20  0907      History   Chief Complaint Chief Complaint  Patient presents with  . Oral Swelling    [phone]    HPI Samantha [name] is a 35 y.o. female.   HPI   35 year old female here for evaluation of upper lip swelling.  Patient reports that her symptoms started last night.  She took 2 Benadryl at that time and helping the swelling down.  Patient still having swelling this morning.  Patient denies swelling of her tongue or difficulty breathing or swallowing.  Patient denies itching.  Patient states that she has not started any new medications, supplements, cosmetics, or new foods.  Patient states that she has not had any cosmetic injections.  Patient was started on Lotrel Effexor XR and HCTZ in November by her primary care provider.  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  Past Medical History:  Diagnosis Date  . Anxiety   . Depression     There are no problems to display for this patient.   Past Surgical History:  Procedure Laterality Date  . NO PAST SURGERIES      OB History    Gravida  2   Para      Term      Preterm      AB  1   Living  1     SAB      IAB      Ectopic      Multiple      Live Births               Home Medications    Prior to Admission medications   Medication Sig Start Date End Date Taking? Authorizing Provider  amlodipine-benazepril (LOTREL) 2.5-10 MG capsule Take 1 capsule by mouth daily. 01/18/20 01/17/21 Yes [provider]  fluticasone (FLONASE) 50 MCG/ACT nasal spray as needed. 04/16/18  Yes [provider]  hydrochlorothiazide (HYDRODIURIL) 12.5 MG tablet Take 12.5 mg by mouth daily. 03/20/19  Yes [provider]  MedroxyPROGESTERone Acetate 150 MG/ML SUSY Inject 1 mL (150 mg total) as directed once. 10/08/16 6/38/75 Yes Copland, Deirdre Evener, PA-C   predniSONE (STERAPRED UNI-PAK 21 TAB) 10 MG (21) TBPK tablet Take by mouth daily. Take 6 tabs by mouth daily  for 2 days, then 5 tabs for 2 days, then 4 tabs for 2 days, then 3 tabs for 2 days, 2 tabs for 2 days, then 1 tab by mouth daily for 2 days 02/15/20  Yes Margarette Canada, NP  traZODone (DESYREL) 50 MG tablet Take by mouth. 12/30/19 12/29/20 Yes [provider]  venlafaxine XR (EFFEXOR-XR) 75 MG 24 hr capsule Take by mouth. 12/30/19 12/29/20 Yes [provider]  cephALEXin (KEFLEX) 500 MG capsule Take 1 capsule (500 mg total) by mouth 2 (two) times daily. Patient not taking: Reported on 10/08/2016 04/02/15   Hinda Kehr, MD  ciprofloxacin (CIPRO) 500 MG tablet Take 1 tablet (500 mg total) by mouth every 12 (twelve) hours. Patient not taking: Reported on 10/08/2016 05/28/15   Norval Gable, MD  hydrocortisone (ANUSOL-HC) 2.5 % rectal cream Place 1 application rectally 3 (three) times daily. 03/28/19   Norval Gable, MD  hydrocortisone (ANUSOL-HC) 25 MG suppository Place 1 suppository (25 mg total) rectally 2 (two) times daily. 03/28/19   Norval Gable, MD  ibuprofen (ADVIL,MOTRIN) 800 MG tablet  Take 1 tablet (800 mg total) by mouth every 8 (eight) hours as needed. Patient not taking: Reported on 10/08/2016 02/06/15   Evon Slack, PA-C  phenazopyridine (PYRIDIUM) 95 MG tablet Take 95 mg by mouth 3 (three) times daily as needed for pain.    [provider]  Phenazopyridine HCl & UTI Test (URISTAT UTI RELIEF PAK CO) by Combination route.    [provider]  sulfamethoxazole-trimethoprim (BACTRIM DS,SEPTRA DS) 800-160 MG tablet Take 1 tablet by mouth 2 (two) times daily. Patient not taking: Reported on 10/08/2016 12/25/14   Darien Ramus, MD    Family History Family History  Problem Relation Age of Onset  . Healthy Mother     Social History Social History   Tobacco Use  . Smoking status: Former Smoker    Packs/day: 0.50    Types: Cigarettes     Quit date: 07/2018    Years since quitting: 1.5  . Smokeless tobacco: Never Used  Vaping Use  . Vaping Use: Never used  Substance Use Topics  . Alcohol use: Yes    Comment: socially  . Drug use: No     Allergies   Patient has no known allergies.   Review of Systems Review of Systems  Constitutional: Negative for activity change, appetite change and fever.  HENT: Positive for facial swelling. Negative for sore throat.   Respiratory: Negative for shortness of breath, wheezing and stridor.   Gastrointestinal: Negative for nausea and vomiting.  Musculoskeletal: Negative for arthralgias and myalgias.  Skin: Negative for rash.  Neurological: Negative for dizziness and syncope.  Hematological: Negative.   Psychiatric/Behavioral: Negative.      Physical Exam Triage Vital Signs ED Triage Vitals  Enc Vitals Group     BP 02/15/20 0942 102/74     Pulse Rate 02/15/20 0942 89     Resp 02/15/20 0942 18     Temp 02/15/20 0942 98.5 F (36.9 C)     Temp Source 02/15/20 0942 Oral     SpO2 02/15/20 0942 99 %     Weight 02/15/20 0928 177 lb 14.6 oz (80.7 kg)     Height 02/15/20 0928 5\' 5"  (1.651 m)     Head Circumference --      Peak Flow --      Pain Score 02/15/20 0928 0     Pain Loc --      Pain Edu? --      Excl. in [place]? --    No data found.  Updated Vital Signs BP 102/74 (BP Location: Right Arm)   Pulse 89   Temp 98.5 F (36.9 C) (Oral)   Resp 18   Ht 5\' 5"  (1.651 m)   Wt 177 lb 14.6 oz (80.7 kg)   SpO2 99%   BMI 29.61 kg/m   Visual Acuity Right Eye Distance:   Left Eye Distance:   Bilateral Distance:    Right Eye Near:   Left Eye Near:    Bilateral Near:     Physical Exam Vitals and nursing note reviewed.  Constitutional:      General: She is not in acute distress.    Appearance: Normal appearance. She is normal weight. She is not toxic-appearing.  HENT:     Head: Normocephalic and atraumatic.     Mouth/Throat:     Mouth: Mucous membranes are moist.      Pharynx: Oropharynx is clear. No oropharyngeal exudate.     Comments: Upper lip is moderately swollen without erythema.  Cardiovascular:     Rate and Rhythm: Normal rate and regular rhythm.     Pulses: Normal pulses.     Heart sounds: Normal heart sounds. No murmur heard. No gallop.   Pulmonary:     Effort: Pulmonary effort is normal.     Breath sounds: Normal breath sounds. No stridor. No wheezing, rhonchi or rales.  Musculoskeletal:     Cervical back: Normal range of motion and neck supple.  Lymphadenopathy:     Cervical: No cervical adenopathy.  Skin:    General: Skin is warm and dry.     Capillary Refill: Capillary refill takes less than 2 seconds.     Findings: No erythema or rash.  Neurological:     General: No focal deficit present.     Mental Status: She is alert and oriented to person, place, and time.  Psychiatric:        Mood and Affect: Mood normal.        Behavior: Behavior normal.        Thought Content: Thought content normal.        Judgment: Judgment normal.      UC Treatments / Results  Labs (all labs ordered are listed, but only abnormal results are displayed) Labs Reviewed - No data to display  EKG   Radiology No results found.  Procedures Procedures (including critical care time)  Medications Ordered in UC Medications - No data to display  Initial Impression / Assessment and Plan / UC Course  I have reviewed the triage vital signs and the nursing notes.  Pertinent labs & imaging results that were available during my care of the patient were reviewed by me and considered in my medical decision making (see chart for details).   Evaluation of upper lip swelling.  Patient states that she noticed the symptoms yesterday when she tried to take a drink and her upper lip did not feel right.  She denies any itching or pain.  Patient is not having difficulty breathing and can manage her own secretions fine.  Patient did take Benadryl 50 mg last night  which helped a little bit.  Etiology of swelling is on clear.  Patient is on combination blood pressure medication calcium channel blocker/ACE inhibitor.  Patient states that she took her medication this morning and has not had an increase in her symptoms.  We discussed that if her symptoms return she may want to discuss allergy testing with her primary care provider.  Will discharge patient home with a diagnosis of lip swelling, possibly allergic response.  Will treat with steroid pack, Pepcid 20 mg twice daily, Allegra 180 mg daily, Benadryl at nighttime as needed for swelling and sleep.  Patient advised that if she develops any swelling of her tongue or difficulty swallowing or breathing she needs call 911 or go to the ER.  We discussed that subsequent responses may be more robust than her first 1.   Final Clinical Impressions(s) / UC Diagnoses   Final diagnoses:  Angioedema, initial encounter     Discharge Instructions     Take the prednisone according to the package instructions.  Take over-the-counter Pepcid 20 mg twice daily.  Take over-the-counter Allegra 180 mg daily.  You can take Benadryl 50 mg at bedtime.  If you develop worsening swelling of your lip that involves swelling of your tongue, difficulty breathing, or difficulty swallowing you need to call 911 or go to the ER.    ED Prescriptions    Medication  Sig Dispense Auth. Provider   predniSONE (STERAPRED UNI-PAK 21 TAB) 10 MG (21) TBPK tablet Take by mouth daily. Take 6 tabs by mouth daily  for 2 days, then 5 tabs for 2 days, then 4 tabs for 2 days, then 3 tabs for 2 days, 2 tabs for 2 days, then 1 tab by mouth daily for 2 days 42 tablet Becky Augusta, NP     PDMP not reviewed this encounter.   Becky Augusta, NP 02/15/20 1024

## 2020-02-15 NOTE — ED Triage Notes (Signed)
Pt c/o top lip swelling. Started yesterday. She took 2 benadryl last night and has improved but not resolved. Denies shortness of breath or trouble breathing. No known allergies. No changes in makeup, medications or soaps.

## 2020-04-19 DIAGNOSIS — Z3042 Encounter for surveillance of injectable contraceptive: Secondary | ICD-10-CM | POA: Diagnosis not present

## 2020-04-23 ENCOUNTER — Other Ambulatory Visit
Admission: RE | Admit: 2020-04-23 | Discharge: 2020-04-23 | Disposition: A | Discharge status: Home / Self Care | Payer: No Typology Code available for payment source | Source: Ambulatory Visit | Attending: Internal Medicine | Admitting: Internal Medicine

## 2020-04-23 DIAGNOSIS — R059 Cough, unspecified: Secondary | ICD-10-CM | POA: Diagnosis not present

## 2020-04-23 DIAGNOSIS — R079 Chest pain, unspecified: Secondary | ICD-10-CM | POA: Diagnosis not present

## 2020-04-23 DIAGNOSIS — R072 Precordial pain: Secondary | ICD-10-CM | POA: Insufficient documentation

## 2020-04-23 DIAGNOSIS — Z3202 Encounter for pregnancy test, result negative: Secondary | ICD-10-CM | POA: Diagnosis not present

## 2020-04-23 DIAGNOSIS — R0600 Dyspnea, unspecified: Secondary | ICD-10-CM | POA: Diagnosis not present

## 2020-04-23 DIAGNOSIS — J019 Acute sinusitis, unspecified: Secondary | ICD-10-CM | POA: Diagnosis not present

## 2020-04-23 DIAGNOSIS — J209 Acute bronchitis, unspecified: Secondary | ICD-10-CM | POA: Diagnosis not present

## 2020-04-23 LAB — D-DIMER, QUANTITATIVE: D-Dimer, Quant: 0.69 ug/mL-FEU — ABNORMAL HIGH (ref 0.00–0.50)

## 2020-04-23 LAB — TROPONIN I (HIGH SENSITIVITY): Troponin I (High Sensitivity): <2 ng/L (ref <18)

## 2020-07-04 DIAGNOSIS — R69 Illness, unspecified: Secondary | ICD-10-CM | POA: Diagnosis not present

## 2020-07-04 DIAGNOSIS — R222 Localized swelling, mass and lump, trunk: Secondary | ICD-10-CM | POA: Diagnosis not present

## 2020-07-04 DIAGNOSIS — F419 Anxiety disorder, unspecified: Secondary | ICD-10-CM | POA: Diagnosis not present

## 2020-07-04 DIAGNOSIS — I10 Essential (primary) hypertension: Secondary | ICD-10-CM | POA: Diagnosis not present

## 2020-07-10 DIAGNOSIS — Z3042 Encounter for surveillance of injectable contraceptive: Secondary | ICD-10-CM | POA: Diagnosis not present

## 2020-07-25 ENCOUNTER — Other Ambulatory Visit (HOSPITAL_COMMUNITY): Payer: Self-pay | Admitting: Gerontology

## 2020-07-25 ENCOUNTER — Other Ambulatory Visit: Payer: Self-pay | Admitting: Gerontology

## 2020-07-25 DIAGNOSIS — R222 Localized swelling, mass and lump, trunk: Secondary | ICD-10-CM

## 2020-08-07 ENCOUNTER — Ambulatory Visit: Payer: No Typology Code available for payment source

## 2020-08-07 ENCOUNTER — Ambulatory Visit
Admission: RE | Admit: 2020-08-07 | Discharge: 2020-08-07 | Disposition: A | Discharge status: Home / Self Care | Payer: No Typology Code available for payment source | Source: Ambulatory Visit | Attending: Gerontology | Admitting: Gerontology

## 2020-08-07 ENCOUNTER — Other Ambulatory Visit: Payer: Self-pay

## 2020-08-07 DIAGNOSIS — R222 Localized swelling, mass and lump, trunk: Secondary | ICD-10-CM | POA: Insufficient documentation

## 2020-08-07 DIAGNOSIS — R0602 Shortness of breath: Secondary | ICD-10-CM | POA: Diagnosis not present

## 2020-08-07 HISTORY — DX: Essential (primary) hypertension: I10

## 2020-08-07 MED ORDER — IOHEXOL 300 MG/ML  SOLN
75.0000 mL | Freq: Once | INTRAMUSCULAR | Status: AC | PRN
  Administered 2020-08-07: 75 mL via INTRAVENOUS

## 2020-08-09 DIAGNOSIS — R222 Localized swelling, mass and lump, trunk: Secondary | ICD-10-CM | POA: Diagnosis not present

## 2020-08-22 ENCOUNTER — Other Ambulatory Visit: Payer: Self-pay

## 2020-08-22 ENCOUNTER — Inpatient Hospital Stay: Payer: No Typology Code available for payment source

## 2020-08-22 ENCOUNTER — Encounter: Payer: Self-pay | Admitting: Oncology

## 2020-08-22 ENCOUNTER — Inpatient Hospital Stay: Payer: No Typology Code available for payment source | Attending: Oncology | Admitting: Oncology

## 2020-08-22 VITALS — BP 132/106 | HR 85 | Temp 100.3°F | Resp 16 | Wt 178.0 lb

## 2020-08-22 DIAGNOSIS — R61 Generalized hyperhidrosis: Secondary | ICD-10-CM | POA: Insufficient documentation

## 2020-08-22 DIAGNOSIS — Z87891 Personal history of nicotine dependence: Secondary | ICD-10-CM | POA: Diagnosis not present

## 2020-08-22 DIAGNOSIS — Z7189 Other specified counseling: Secondary | ICD-10-CM

## 2020-08-22 DIAGNOSIS — R591 Generalized enlarged lymph nodes: Secondary | ICD-10-CM

## 2020-08-22 DIAGNOSIS — D869 Sarcoidosis, unspecified: Secondary | ICD-10-CM | POA: Insufficient documentation

## 2020-08-22 LAB — CBC WITH DIFFERENTIAL/PLATELET
Abs Immature Granulocytes: 0.01 10*3/uL (ref 0.00–0.07)
Basophils Absolute: 0 10*3/uL (ref 0.0–0.1)
Basophils Relative: 1 %
Eosinophils Absolute: 0.2 10*3/uL (ref 0.0–0.5)
Eosinophils Relative: 6 %
HCT: 38.1 % (ref 36.0–46.0)
Hemoglobin: 12.9 g/dL (ref 12.0–15.0)
Immature Granulocytes: 0 %
Lymphocytes Relative: 49 %
Lymphs Abs: 1.8 10*3/uL (ref 0.7–4.0)
MCH: 29.6 pg (ref 26.0–34.0)
MCHC: 33.9 g/dL (ref 30.0–36.0)
MCV: 87.4 fL (ref 80.0–100.0)
Monocytes Absolute: 0.3 10*3/uL (ref 0.1–1.0)
Monocytes Relative: 9 %
Neutro Abs: 1.3 10*3/uL — ABNORMAL LOW (ref 1.7–7.7)
Neutrophils Relative %: 35 %
Platelets: 265 10*3/uL (ref 150–400)
RBC: 4.36 MIL/uL (ref 3.87–5.11)
RDW: 12.8 % (ref 11.5–15.5)
WBC: 3.8 10*3/uL — ABNORMAL LOW (ref 4.0–10.5)
nRBC: 0 % (ref 0.0–0.2)

## 2020-08-22 LAB — COMPREHENSIVE METABOLIC PANEL
ALT: 15 U/L (ref 0–44)
AST: 21 U/L (ref 15–41)
Albumin: 4.3 g/dL (ref 3.5–5.0)
Alkaline Phosphatase: 61 U/L (ref 38–126)
Anion gap: 6 (ref 5–15)
BUN: 13 mg/dL (ref 6–20)
CO2: 26 mmol/L (ref 22–32)
Calcium: 8.9 mg/dL (ref 8.9–10.3)
Chloride: 102 mmol/L (ref 98–111)
Creatinine, Ser: 0.69 mg/dL (ref 0.44–1.00)
GFR, Estimated: >60 mL/min (ref >60)
Glucose, Bld: 89 mg/dL (ref 70–99)
Potassium: 3.3 mmol/L — ABNORMAL LOW (ref 3.5–5.1)
Sodium: 134 mmol/L — ABNORMAL LOW (ref 135–145)
Total Bilirubin: 0.5 mg/dL (ref 0.3–1.2)
Total Protein: 8 g/dL (ref 6.5–8.1)

## 2020-08-22 LAB — HIV ANTIBODY (ROUTINE TESTING W REFLEX): HIV Screen 4th Generation wRfx: NONREACTIVE

## 2020-08-22 LAB — RETIC PANEL
Immature Retic Fract: 11 % (ref 2.3–15.9)
RBC.: 4.38 MIL/uL (ref 3.87–5.11)
Retic Count, Absolute: 87.2 10*3/uL (ref 19.0–186.0)
Retic Ct Pct: 2 % (ref 0.4–3.1)
Reticulocyte Hemoglobin: 34.9 pg (ref >27.9)

## 2020-08-22 LAB — LACTATE DEHYDROGENASE: LDH: 204 U/L — ABNORMAL HIGH (ref 98–192)

## 2020-08-22 LAB — APTT: aPTT: 31 seconds (ref 24–36)

## 2020-08-22 LAB — PROTIME-INR
INR: 1 (ref 0.8–1.2)
Prothrombin Time: 13.2 seconds (ref 11.4–15.2)

## 2020-08-22 LAB — TECHNOLOGIST SMEAR REVIEW

## 2020-08-22 NOTE — Progress Notes (Signed)
Patient here for initial oncology appointment, expresses concerns of fatigue, chest pain, nausea, and constipation

## 2020-08-22 NOTE — Progress Notes (Signed)
Hematology/Oncology Consult note [facility] [phone] [phone]   Patient Care Team: Toni Arthurs, NP as PCP - General (Family Medicine)  REFERRING PROVIDER: Toni Arthurs, NP  CHIEF COMPLAINTS/REASON FOR VISIT:  Evaluation of chest mass  HISTORY OF PRESENTING ILLNESS:   Samantha [name] is a  35 y.o.  female with PMH listed below was seen in consultation at the request of  Toni Arthurs, NP  for evaluation of chest mass  Patient reports cough and congestion since March 2022.  She had a CXR done on 04/23/20 at [facility]. Result is not available to me via care everywhere.  Patient was told that there was some abnormal findings in her lungs which needs follow-up. Patient follows up with her primary care provider and a CT was obtained for further evaluation of persistent cough, congestion and shortness of breath. 08/07/2020, CT chest unfortunately showed extensive lymphadenopathy throughout chest and visualized abdomen-2 mm nodular opacity right upper lobe.-Incomplete visualization from mass arising from the mid right kidney.  1.7 x 1.5 cm.  Hepatic steatosis. Patient was referred to establish care with oncology for further evaluation.  She was accompanied by grandmother today.  Patient works as a Occupational psychologist at [employer].  Denies any unintentional weight loss.  Endorses night sweats for the past few months.  No fever or chills.  Other medical problems including anxiety, depression, diabetes.  Review of Systems  Constitutional:  Negative for appetite change, chills, fatigue and fever.  HENT:   Negative for hearing loss and voice change.   Eyes:  Negative for eye problems.  Respiratory:  Positive for cough and shortness of breath. Negative for chest tightness.   Cardiovascular:  Negative for chest pain.  Gastrointestinal:  Negative for abdominal distention, abdominal pain and blood in stool.  Endocrine: Negative for hot flashes.        Night sweats  Genitourinary:  Negative for difficulty urinating and frequency.   Musculoskeletal:  Negative for arthralgias.  Skin:  Negative for itching and rash.  Neurological:  Negative for extremity weakness.  Hematological:  Negative for adenopathy.  Psychiatric/Behavioral:  Negative for confusion.    MEDICAL HISTORY:  Past Medical History:  Diagnosis Date   Anxiety    Depression    Hypertension    Pre-diabetes     SURGICAL HISTORY: Past Surgical History:  Procedure Laterality Date   NO PAST SURGERIES      SOCIAL HISTORY: Social History   Socioeconomic History   Marital status: Single    Spouse name: Not on file   Number of children: Not on file   Years of education: Not on file   Highest education level: Not on file  Occupational History   Not on file  Tobacco Use   Smoking status: Former    Packs/day: 0.50    Types: Cigarettes    Quit date: 07/2018    Years since quitting: 2.1   Smokeless tobacco: Never  Vaping Use   Vaping Use: Never used  Substance and Sexual Activity   Alcohol use: Yes    Comment: socially   Drug use: No   Sexual activity: Yes  Other Topics Concern   Not on file  Social History Narrative   Not on file   Social Determinants of Health   Financial Resource Strain: Not on file  Food Insecurity: Not on file  Transportation Needs: Not on file  Physical Activity: Not on file  Stress: Not on file  Social Connections: Not on file  Intimate Partner Violence: Not on file    FAMILY HISTORY: Family History  Problem Relation Age of Onset   Healthy Mother     ALLERGIES:  has No Known Allergies.  MEDICATIONS:  Current Outpatient Medications  Medication Sig Dispense Refill   fluticasone (FLONASE) 50 MCG/ACT nasal spray as needed.     hydrochlorothiazide (HYDRODIURIL) 12.5 MG tablet Take 12.5 mg by mouth daily.     hydrocortisone (ANUSOL-HC) 2.5 % rectal cream Place 1 application rectally 3 (three) times daily. 30 g 0    MedroxyPROGESTERone Acetate 150 MG/ML SUSY Inject 1 mL (150 mg total) as directed once. 1 Syringe 3   Phenazopyridine HCl & UTI Test (URISTAT UTI RELIEF PAK CO) by Combination route.     traZODone (DESYREL) 50 MG tablet Take by mouth.     venlafaxine XR (EFFEXOR-XR) 75 MG 24 hr capsule Take by mouth.     amLODipine (NORVASC) 5 MG tablet Take 5 mg by mouth daily.     amlodipine-benazepril (LOTREL) 2.5-10 MG capsule Take 1 capsule by mouth daily. (Patient not taking: Reported on 08/22/2020)     buPROPion (WELLBUTRIN XL) 150 MG 24 hr tablet Take 150 mg by mouth daily.     cephALEXin (KEFLEX) 500 MG capsule Take 1 capsule (500 mg total) by mouth 2 (two) times daily. 14 capsule 0   hydrocortisone (ANUSOL-HC) 25 MG suppository Place 1 suppository (25 mg total) rectally 2 (two) times daily. (Patient not taking: Reported on 08/22/2020) 12 suppository 0   ibuprofen (ADVIL,MOTRIN) 800 MG tablet Take 1 tablet (800 mg total) by mouth every 8 (eight) hours as needed. (Patient not taking: No sig reported) 30 tablet 0   predniSONE (STERAPRED UNI-PAK 21 TAB) 10 MG (21) TBPK tablet Take by mouth daily. Take 6 tabs by mouth daily  for 2 days, then 5 tabs for 2 days, then 4 tabs for 2 days, then 3 tabs for 2 days, 2 tabs for 2 days, then 1 tab by mouth daily for 2 days 42 tablet 0   No current facility-administered medications for this visit.     PHYSICAL EXAMINATION: ECOG PERFORMANCE STATUS: 1 - Symptomatic but completely ambulatory Vitals:   08/22/20 1500  BP: (!) 132/106  Pulse: 85  Resp: 16  Temp: 100.3 F (37.9 C)  SpO2: 100%   Filed Weights   08/22/20 1500  Weight: 178 lb (80.7 kg)    Physical Exam Constitutional:      General: She is not in acute distress. HENT:     Head: Normocephalic and atraumatic.  Eyes:     General: No scleral icterus. Cardiovascular:     Rate and Rhythm: Normal rate and regular rhythm.     Heart sounds: Normal heart sounds.  Pulmonary:     Effort: Pulmonary effort  is normal. No respiratory distress.     Breath sounds: No wheezing.  Abdominal:     General: Bowel sounds are normal. There is no distension.     Palpations: Abdomen is soft.  Musculoskeletal:        General: No deformity. Normal range of motion.     Cervical back: Normal range of motion and neck supple.  Skin:    General: Skin is warm and dry.     Findings: No erythema or rash.  Neurological:     Mental Status: She is alert and oriented to person, place, and time. Mental status is at baseline.     Cranial Nerves: No cranial nerve deficit.  Coordination: Coordination normal.  Psychiatric:        Mood and Affect: Mood normal.    LABORATORY DATA:  I have reviewed the data as listed Lab Results  Component Value Date   WBC 3.8 (L) 08/22/2020   HGB 12.9 08/22/2020   HCT 38.1 08/22/2020   MCV 87.4 08/22/2020   PLT 265 08/22/2020   Recent Labs    08/22/20 1553  NA 134*  K 3.3*  CL 102  CO2 26  GLUCOSE 89  BUN 13  CREATININE 0.69  CALCIUM 8.9  GFRNONAA >60  PROT 8.0  ALBUMIN 4.3  AST 21  ALT 15  ALKPHOS 61  BILITOT 0.5   Iron/TIBC/Ferritin/ %Sat No results found for: IRON, TIBC, FERRITIN, IRONPCTSAT    RADIOGRAPHIC STUDIES: I have personally reviewed the radiological images as listed and agreed with the findings in the report. CT CHEST W CONTRAST  Result Date: 08/07/2020 CLINICAL DATA:  Shortness of breath with cough and congestion EXAM: CT CHEST WITH CONTRAST TECHNIQUE: Multidetector CT imaging of the chest was performed during intravenous contrast administration. CONTRAST:  56m OMNIPAQUE IOHEXOL 300 MG/ML  SOLN COMPARISON:  Report of prior chest radiograph April 23, 2020; images from that study not available currently. FINDINGS: Cardiovascular: No thoracic aortic aneurysm or dissection. Visualized great vessels appear unremarkable. Note that the left vertebral artery arises directly from the aortic arch, an anatomic variant. No evident pulmonary embolus. No  pericardial effusion or pericardial thickening. Mediastinum/Nodes: Thyroid appears normal. No appreciable esophageal lesions. There is extensive multifocal adenopathy throughout the thoracic region. There are enlarged supraclavicular lymph nodes on the right, largest measuring 1.1 x 1.1 cm. Several upper normal left subclavian lymph nodes noted. There is extensive adenopathy in the right paratracheal region. Largest individual lymph node in this area measures 3.0 x 2.8 cm. There are lymph nodes to the left of the aortic arch, largest measuring 1.8 x 1.6 cm. There is an aortopulmonary window lymph node measuring 1.5 x 1.3 cm. Adenopathy surrounds the lower trachea and carina. There is a right precarinal lymph node measuring 2.4 by 1.8 cm. A lymph node to the left of the carina measures 1.9 x 1.2 cm. Multiple enlarged subcarinal lymph nodes present. Largest individual lymph node measures 2.7 x 2.1 cm. There are lymph nodes in each hilar region. Largest right hilar lymph node measures 2.1 x 1.9 cm. Largest left hilar lymph node measures 2.2 x 1.8 cm. Lungs/Pleura: There is no appreciable edema or consolidation. Mild lower lobe atelectatic change noted. No pleural effusions. On axial slice 50 series 3, there is a 2 mm nodular opacity in the anterior segment of the right upper lobe. The trachea and major bronchial structures appear patent. No pneumothorax. Upper Abdomen: There is presumed adenopathy in the left upper abdomen located between the left kidney and pancreas. This focus of adenopathy measures 5.3 x 4.9 cm. There is retroperitoneal adenopathy located between the aorta and right kidney measuring 1.8 x 1.4 cm. Adenopathy to the right of the head of the pancreas measures 2.3 x 1.8 cm. There is hepatic steatosis. Spleen normal in size. There is incomplete visualization of a mass arising from the lateral mid right kidney measuring 1.7 x 1.5 cm. This lesion cannot be classified as a simple cyst by attenuation  criteria. Musculoskeletal: No blastic or lytic bone lesions. No evident chest wall lesions. IMPRESSION: 1. Extensive multifocal adenopathy throughout the chest and visualized abdomen, almost certainly of neoplastic etiology. Etiology for this adenopathy uncertain. 2. 2 mm  nodular opacity right upper lobe which potentially could represent a small neoplastic focus given the adenopathy. No other similar nodular opacities evident. No edema or airspace consolidation. No pleural effusions. 3. Incomplete visualization of a mass arising from the mid right kidney which cannot be classified as a simple cyst. This mass measures 1.7 x 1.5 cm on this examination. Complete CT or MR through the kidneys pre and post-contrast advised to further evaluate this lesion. 4.  Hepatic steatosis. These results will be called to the ordering clinician or representative by the Radiologist Assistant, and communication documented in the PACS or [employer]. Electronically Signed   By: Lowella Grip III M.D.   On: 08/07/2020 13:27      ASSESSMENT & PLAN:  1. Lymphadenopathy   2. Night sweats   3. Goals of care, counseling/discussion    #CT images were reviewed independently with me and together with patient and her grandmother. Very concerning extensive lymphadenopathy in the chest and visualized abdomen.  Suspect lymphoma Check CBC, CMP, LDH, flow cytometry, SPEP, reticulocyte panel, HIV, hepatitis panel, PT and PTT, smear Will need additional images for staging.  I will obtain PET scan. CT-guided biopsy of right supraclavicular lymph node to establish tissue diagnosis.  If not feasible due to the size, we will look for other accessible sites from PET scan.  Orders Placed This Encounter  Procedures   NM PET Image Initial (PI) Skull Base To Thigh    Standing Status:   Future    Standing Expiration Date:   08/22/2021    Order Specific Question:   If indicated for the ordered procedure, I authorize the administration of  a radiopharmaceutical per Radiology protocol    Answer:   Yes    Order Specific Question:   Is the patient pregnant?    Answer:   No    Order Specific Question:   Preferred imaging location?    Answer:   [facility]   CT Biopsy    Standing Status:   Future    Standing Expiration Date:   08/22/2021    Order Specific Question:   Lab orders requested (DO NOT place separate lab orders, these will be automatically ordered during procedure specimen collection):    Answer:   Surgical Pathology    Order Specific Question:   Reason for Exam (SYMPTOM  OR DIAGNOSIS REQUIRED)    Answer:   Right supraclavicular lymph node    Order Specific Question:   Is patient pregnant?    Answer:   No    Order Specific Question:   Preferred location?    Answer:   [facility]   Comprehensive metabolic panel    Standing Status:   Future    Number of Occurrences:   1    Standing Expiration Date:   08/22/2021   CBC with Differential/Platelet    Standing Status:   Future    Number of Occurrences:   1    Standing Expiration Date:   08/22/2021   Lactate dehydrogenase    Standing Status:   Future    Number of Occurrences:   1    Standing Expiration Date:   08/22/2021   Retic Panel    Standing Status:   Future    Number of Occurrences:   1    Standing Expiration Date:   08/22/2021   Multiple Myeloma Panel (SPEP&IFE w/QIG)    Standing Status:   Future    Number of Occurrences:   1  Standing Expiration Date:   08/22/2021   Kappa/lambda light chains    Standing Status:   Future    Number of Occurrences:   1    Standing Expiration Date:   08/22/2021   Flow cytometry panel-leukemia/lymphoma work-up    Standing Status:   Future    Number of Occurrences:   1    Standing Expiration Date:   08/22/2021   HIV Antibody (routine testing w rflx)    Standing Status:   Future    Number of Occurrences:   1    Standing Expiration Date:   08/22/2021   Protime-INR    Standing Status:   Future    Number of  Occurrences:   1    Standing Expiration Date:   08/22/2021   APTT    Standing Status:   Future    Number of Occurrences:   1    Standing Expiration Date:   08/22/2021   Technologist smear review    Standing Status:   Future    Number of Occurrences:   1    Standing Expiration Date:   08/22/2021    All questions were answered. The patient knows to call the clinic with any problems questions or concerns.  cc Toni Arthurs, NP    Return of visit: to be determined. Thank you for this kind referral and the opportunity to participate in the care of this patient. A copy of today's note is routed to referring provider    Earlie Server, MD, PhD Hematology Oncology [facility] Pager- [phone] 08/22/2020

## 2020-08-23 ENCOUNTER — Telehealth: Payer: Self-pay

## 2020-08-23 ENCOUNTER — Other Ambulatory Visit: Payer: Self-pay

## 2020-08-23 DIAGNOSIS — R591 Generalized enlarged lymph nodes: Secondary | ICD-10-CM

## 2020-08-23 LAB — KAPPA/LAMBDA LIGHT CHAINS
Kappa free light chain: 23.8 mg/L — ABNORMAL HIGH (ref 3.3–19.4)
Kappa, lambda light chain ratio: 1.39 (ref 0.26–1.65)
Lambda free light chains: 17.1 mg/L (ref 5.7–26.3)

## 2020-08-23 NOTE — Telephone Encounter (Signed)
I was unable to reach patient to let her know about biospy appt but I left a detailed message and sent a Mychart message.   Patient scheduled for lymph node biopsy on 7/20 @ 1p with arrival time at 12p. Please schedule patient for MD follow up approx 1 week after biospy and notify pt of appt.

## 2020-08-24 LAB — COMP PANEL: LEUKEMIA/LYMPHOMA

## 2020-08-24 NOTE — Telephone Encounter (Signed)
08/24/2020 Spoke w/ pt and informed her of biopsy on 7/20. Reminded her to arrive @ 12pm at the [facility], and to be NPO 8 hrs prior, except for blood pressure/heart/seizure medications, which can be taken w/ water per AVS instructions. Also let her know that I scheduled her f/u w/ Dr.Yu for 7/26 @ 2:30. Pt confirmed both appts and does not have any questions at this time SRW

## 2020-08-27 ENCOUNTER — Other Ambulatory Visit: Payer: Self-pay | Admitting: Radiology

## 2020-08-27 LAB — MULTIPLE MYELOMA PANEL, SERUM
Albumin SerPl Elph-Mcnc: 3.9 g/dL (ref 2.9–4.4)
Albumin/Glob SerPl: 1.2 (ref 0.7–1.7)
Alpha 1: 0.2 g/dL (ref 0.0–0.4)
Alpha2 Glob SerPl Elph-Mcnc: 0.6 g/dL (ref 0.4–1.0)
B-Globulin SerPl Elph-Mcnc: 1.1 g/dL (ref 0.7–1.3)
Gamma Glob SerPl Elph-Mcnc: 1.5 g/dL (ref 0.4–1.8)
Globulin, Total: 3.4 g/dL (ref 2.2–3.9)
IgA: 285 mg/dL (ref 87–352)
IgG (Immunoglobin G), Serum: 1640 mg/dL — ABNORMAL HIGH (ref 586–1602)
IgM (Immunoglobulin M), Srm: 153 mg/dL (ref 26–217)
Total Protein ELP: 7.3 g/dL (ref 6.0–8.5)

## 2020-08-27 NOTE — Progress Notes (Signed)
Patient on schedule for Supraclavicular LN biopsy 08/29/2020, spoke with patient on phone with pre procedure instructions given. Made aware to be here @ 1200, NPO after 0600, and driver post procedure/recovery/discharge. Stated understanding.

## 2020-08-28 ENCOUNTER — Ambulatory Visit
Admission: RE | Admit: 2020-08-28 | Discharge: 2020-08-28 | Disposition: A | Discharge status: Home / Self Care | Payer: No Typology Code available for payment source | Source: Ambulatory Visit | Attending: Oncology | Admitting: Oncology

## 2020-08-28 ENCOUNTER — Other Ambulatory Visit: Payer: Self-pay

## 2020-08-28 DIAGNOSIS — R590 Localized enlarged lymph nodes: Secondary | ICD-10-CM | POA: Diagnosis not present

## 2020-08-28 DIAGNOSIS — R591 Generalized enlarged lymph nodes: Secondary | ICD-10-CM | POA: Diagnosis not present

## 2020-08-28 DIAGNOSIS — Z87891 Personal history of nicotine dependence: Secondary | ICD-10-CM | POA: Insufficient documentation

## 2020-08-28 DIAGNOSIS — Z79899 Other long term (current) drug therapy: Secondary | ICD-10-CM | POA: Diagnosis not present

## 2020-08-28 DIAGNOSIS — I10 Essential (primary) hypertension: Secondary | ICD-10-CM | POA: Insufficient documentation

## 2020-08-28 MED ORDER — IOHEXOL 350 MG/ML SOLN
100.0000 mL | Freq: Once | INTRAVENOUS | Status: AC | PRN
  Administered 2020-08-28: 100 mL via INTRAVENOUS

## 2020-08-28 NOTE — Telephone Encounter (Signed)
PET Denied, PET changed to Ct abd/pelvis w & wo. Per Radiology order needs to be changed to Ct abd pelvis with.   Pt on her way to get CT done this morning.

## 2020-08-29 ENCOUNTER — Other Ambulatory Visit: Payer: Self-pay

## 2020-08-29 ENCOUNTER — Ambulatory Visit
Admission: RE | Admit: 2020-08-29 | Discharge: 2020-08-29 | Disposition: A | Discharge status: Home / Self Care | Payer: No Typology Code available for payment source | Source: Ambulatory Visit | Attending: Oncology | Admitting: Oncology

## 2020-08-29 DIAGNOSIS — R591 Generalized enlarged lymph nodes: Secondary | ICD-10-CM

## 2020-08-29 DIAGNOSIS — Z79899 Other long term (current) drug therapy: Secondary | ICD-10-CM | POA: Diagnosis not present

## 2020-08-29 DIAGNOSIS — R590 Localized enlarged lymph nodes: Secondary | ICD-10-CM | POA: Diagnosis not present

## 2020-08-29 DIAGNOSIS — Z87891 Personal history of nicotine dependence: Secondary | ICD-10-CM | POA: Diagnosis not present

## 2020-08-29 DIAGNOSIS — I10 Essential (primary) hypertension: Secondary | ICD-10-CM | POA: Diagnosis not present

## 2020-08-29 MED ORDER — MIDAZOLAM HCL 2 MG/2ML IJ SOLN
INTRAMUSCULAR | Status: AC
  Filled 2020-08-29: qty 2

## 2020-08-29 MED ORDER — FENTANYL CITRATE (PF) 100 MCG/2ML IJ SOLN
INTRAMUSCULAR | Status: AC
  Filled 2020-08-29: qty 2

## 2020-08-29 MED ORDER — SODIUM CHLORIDE 0.9 % IV SOLN: INTRAVENOUS | Status: DC

## 2020-08-29 MED ORDER — MIDAZOLAM HCL 2 MG/2ML IJ SOLN
INTRAMUSCULAR | Status: AC | PRN
  Administered 2020-08-29: 1 mg via INTRAVENOUS

## 2020-08-29 MED ORDER — FENTANYL CITRATE (PF) 100 MCG/2ML IJ SOLN
INTRAMUSCULAR | Status: AC | PRN
  Administered 2020-08-29: 50 ug via INTRAVENOUS

## 2020-08-29 NOTE — H&P (Signed)
Chief Complaint:    Referring Physician(s): Yu,Zhou    Patient Status: [facility] - Out-pt  History of Present Illness: Samantha [name] is a 35 y.o. female with unexplained neck and chest adenopathy by CT imaging.  Imaging findings concerning for lymphoproliferative process such as lymphoma.  Plan for ultrasound-guided right supraclavicular lymph node core biopsy today.  No recent illness or fevers.  She is referred by oncology.  Stable weight and appetite.  Past medical history of depression and diabetes.  Past Medical History:  Diagnosis Date   Anxiety    Depression    Hypertension    Pre-diabetes     Past Surgical History:  Procedure Laterality Date   NO PAST SURGERIES      Allergies: Patient has no known allergies.  Medications: Prior to Admission medications   Medication Sig Start Date End Date Taking? Authorizing Provider  amLODipine (NORVASC) 5 MG tablet Take 5 mg by mouth daily. 07/04/20  Yes [provider]  buPROPion (WELLBUTRIN XL) 150 MG 24 hr tablet Take 150 mg by mouth daily. 07/04/20  Yes [provider]  fluticasone (FLONASE) 50 MCG/ACT nasal spray as needed. 04/16/18  Yes [provider]  hydrochlorothiazide (HYDRODIURIL) 12.5 MG tablet Take 12.5 mg by mouth daily. 03/20/19  Yes [provider]  traZODone (DESYREL) 50 MG tablet Take by mouth. 12/30/19 12/29/20 Yes [provider]  venlafaxine XR (EFFEXOR-XR) 75 MG 24 hr capsule Take by mouth. 12/30/19 12/29/20 Yes [provider]  amlodipine-benazepril (LOTREL) 2.5-10 MG capsule Take 1 capsule by mouth daily. Patient not taking: No sig reported 01/18/20 01/17/21  [provider]  cephALEXin (KEFLEX) 500 MG capsule Take 1 capsule (500 mg total) by mouth 2 (two) times daily. Patient not taking: Reported on 08/29/2020 04/02/15   Loleta Rose, MD  hydrocortisone (ANUSOL-HC) 2.5 % rectal cream Place 1 application rectally 3 (three) times daily. Patient not  taking: Reported on 08/29/2020 03/28/19   Payton Mccallum, MD  hydrocortisone (ANUSOL-HC) 25 MG suppository Place 1 suppository (25 mg total) rectally 2 (two) times daily. Patient not taking: No sig reported 03/28/19   Payton Mccallum, MD  ibuprofen (ADVIL,MOTRIN) 800 MG tablet Take 1 tablet (800 mg total) by mouth every 8 (eight) hours as needed. Patient not taking: No sig reported 02/06/15   Evon Slack, PA-C  MedroxyPROGESTERone Acetate 150 MG/ML SUSY Inject 1 mL (150 mg total) as directed once. Patient taking differently: Inject 150 mg as directed every 3 (three) months. 10/08/16 08/22/20  Copland, Ilona Sorrel, PA-C  Phenazopyridine HCl & UTI Test (URISTAT UTI RELIEF PAK CO) by Combination route.    [provider]  predniSONE (STERAPRED UNI-PAK 21 TAB) 10 MG (21) TBPK tablet Take by mouth daily. Take 6 tabs by mouth daily  for 2 days, then 5 tabs for 2 days, then 4 tabs for 2 days, then 3 tabs for 2 days, 2 tabs for 2 days, then 1 tab by mouth daily for 2 days Patient not taking: Reported on 08/29/2020 02/15/20   Becky Augusta, NP     Family History  Problem Relation Age of Onset   Healthy Mother     Social History   Socioeconomic History   Marital status: Single    Spouse name: Not on file   Number of children: Not on file   Years of education: Not on file   Highest education level: Not on file  Occupational History   Not on file  Tobacco Use   Smoking  status: Former    Packs/day: 0.50    Types: Cigarettes    Quit date: 07/2018    Years since quitting: 2.1   Smokeless tobacco: Never  Vaping Use   Vaping Use: Never used  Substance and Sexual Activity   Alcohol use: Yes    Comment: socially   Drug use: No   Sexual activity: Yes  Other Topics Concern   Not on file  Social History Narrative   Not on file   Social Determinants of Health   Financial Resource Strain: Not on file  Food Insecurity: Not on file  Transportation Needs: Not on file  Physical Activity: Not  on file  Stress: Not on file  Social Connections: Not on file    ECOG Status: 0 - Asymptomatic  Review of Systems: A 12 point ROS discussed and pertinent positives are indicated in the HPI above.  All other systems are negative.  Review of Systems  Vital Signs: BP (!) 124/100   Pulse 87   Resp 17   Ht 5\' 5"  (1.651 m)   Wt 81.6 kg   SpO2 97%   BMI 29.95 kg/m   Physical Exam  Imaging: CT CHEST W CONTRAST  Result Date: 08/07/2020 CLINICAL DATA:  Shortness of breath with cough and congestion EXAM: CT CHEST WITH CONTRAST TECHNIQUE: Multidetector CT imaging of the chest was performed during intravenous contrast administration. CONTRAST:  70mL OMNIPAQUE IOHEXOL 300 MG/ML  SOLN COMPARISON:  Report of prior chest radiograph April 23, 2020; images from that study not available currently. FINDINGS: Cardiovascular: No thoracic aortic aneurysm or dissection. Visualized great vessels appear unremarkable. Note that the left vertebral artery arises directly from the aortic arch, an anatomic variant. No evident pulmonary embolus. No pericardial effusion or pericardial thickening. Mediastinum/Nodes: Thyroid appears normal. No appreciable esophageal lesions. There is extensive multifocal adenopathy throughout the thoracic region. There are enlarged supraclavicular lymph nodes on the right, largest measuring 1.1 x 1.1 cm. Several upper normal left subclavian lymph nodes noted. There is extensive adenopathy in the right paratracheal region. Largest individual lymph node in this area measures 3.0 x 2.8 cm. There are lymph nodes to the left of the aortic arch, largest measuring 1.8 x 1.6 cm. There is an aortopulmonary window lymph node measuring 1.5 x 1.3 cm. Adenopathy surrounds the lower trachea and carina. There is a right precarinal lymph node measuring 2.4 by 1.8 cm. A lymph node to the left of the carina measures 1.9 x 1.2 cm. Multiple enlarged subcarinal lymph nodes present. Largest individual lymph node  measures 2.7 x 2.1 cm. There are lymph nodes in each hilar region. Largest right hilar lymph node measures 2.1 x 1.9 cm. Largest left hilar lymph node measures 2.2 x 1.8 cm. Lungs/Pleura: There is no appreciable edema or consolidation. Mild lower lobe atelectatic change noted. No pleural effusions. On axial slice 50 series 3, there is a 2 mm nodular opacity in the anterior segment of the right upper lobe. The trachea and major bronchial structures appear patent. No pneumothorax. Upper Abdomen: There is presumed adenopathy in the left upper abdomen located between the left kidney and pancreas. This focus of adenopathy measures 5.3 x 4.9 cm. There is retroperitoneal adenopathy located between the aorta and right kidney measuring 1.8 x 1.4 cm. Adenopathy to the right of the head of the pancreas measures 2.3 x 1.8 cm. There is hepatic steatosis. Spleen normal in size. There is incomplete visualization of a mass arising from the lateral mid right kidney measuring  1.7 x 1.5 cm. This lesion cannot be classified as a simple cyst by attenuation criteria. Musculoskeletal: No blastic or lytic bone lesions. No evident chest wall lesions. IMPRESSION: 1. Extensive multifocal adenopathy throughout the chest and visualized abdomen, almost certainly of neoplastic etiology. Etiology for this adenopathy uncertain. 2. 2 mm nodular opacity right upper lobe which potentially could represent a small neoplastic focus given the adenopathy. No other similar nodular opacities evident. No edema or airspace consolidation. No pleural effusions. 3. Incomplete visualization of a mass arising from the mid right kidney which cannot be classified as a simple cyst. This mass measures 1.7 x 1.5 cm on this examination. Complete CT or MR through the kidneys pre and post-contrast advised to further evaluate this lesion. 4.  Hepatic steatosis. These results will be called to the ordering clinician or representative by the Radiologist Assistant, and  communication documented in the PACS or [employer]board. Electronically Signed   By: Bretta BangWilliam  Woodruff III M.D.   On: 08/07/2020 13:27   CT Abdomen Pelvis W Contrast  Result Date: 08/28/2020 CLINICAL DATA:  35 year old female with history of neck lymphadenopathy. EXAM: CT ABDOMEN AND PELVIS WITH CONTRAST TECHNIQUE: Multidetector CT imaging of the abdomen and pelvis was performed using the standard protocol following bolus administration of intravenous contrast. CONTRAST:  100mL OMNIPAQUE IOHEXOL 350 MG/ML SOLN COMPARISON:  Chest CT 08/07/2020. No prior CT of the abdomen or pelvis. FINDINGS: Lower chest: Unremarkable. Hepatobiliary: No suspicious cystic or solid hepatic lesions. No intra or extrahepatic biliary ductal dilatation. Gallbladder is normal in appearance. Pancreas: No pancreatic mass. No pancreatic ductal dilatation. No pancreatic or peripancreatic fluid collections or inflammatory changes. Spleen: Normal in size and unremarkable in appearance. Adrenals/Urinary Tract: 2.1 cm low-attenuation lesion in the lateral aspect of the interpolar region of the right kidney, compatible with a simple cyst. Left kidney and bilateral adrenal glands are normal in appearance. No hydroureteronephrosis. Urinary bladder is normal in appearance. Stomach/Bowel: The appearance of the stomach is normal. No pathologic dilatation of small bowel or colon. Normal appendix. Vascular/Lymphatic: No significant atherosclerotic disease, aneurysm or dissection noted in the abdominal or pelvic vasculature. Prominent lymph nodes in the hepatic duodenal ligament measuring up to 1.7 cm in short axis. No other lymphadenopathy noted elsewhere in the abdomen or pelvis. Reproductive: Uterus and ovaries are unremarkable in appearance. Other: No significant volume of ascites.  No pneumoperitoneum. Musculoskeletal: There are no aggressive appearing lytic or blastic lesions noted in the visualized portions of the skeleton. IMPRESSION: 1.  Lymphadenopathy in the region of the hepatic duodenal ligament measuring up to 1.7 cm in short axis. Given the findings in the thorax, clinical correlation for signs and symptoms of lymphoproliferative disease is recommended. 2. No pelvic lymphadenopathy. 3. No acute findings are noted in the abdomen or pelvis. Electronically Signed   By: Trudie Reedaniel  Entrikin M.D.   On: 08/28/2020 13:43    Labs:  CBC: Recent Labs    08/22/20 1553  WBC 3.8*  HGB 12.9  HCT 38.1  PLT 265    COAGS: Recent Labs    08/22/20 1553  INR 1.0  APTT 31    BMP: Recent Labs    08/22/20 1553  NA 134*  K 3.3*  CL 102  CO2 26  GLUCOSE 89  BUN 13  CALCIUM 8.9  CREATININE 0.69  GFRNONAA >60    LIVER FUNCTION TESTS: Recent Labs    08/22/20 1553  BILITOT 0.5  AST 21  ALT 15  ALKPHOS 61  PROT 8.0  ALBUMIN 4.3    TUMOR MARKERS: No results for input(s): AFPTM, CEA, CA199, CHROMGRNA in the last 8760 hours.  Assessment and Plan:  Neck and chest adenopathy concerning for lymphoproliferative process such as lymphoma.  Plan for [place] right supraclavicular lymph node core biopsy.  Risks and benefits of US node bx was discussed with the patient and/or patient's family including, but not limited to bleeding, infection, damage to adjacent structures or low yield requiring additional tests.  All of the questions were answered and there is agreement to proceed.  Consent signed and in chart.   Thank you for this interesting consult.  I greatly enjoyed meeting Samantha [name] and look forward to participating in their care.  A copy of this report was sent to the requesting provider on this date.  Electronically Signed: Berdine Dance, MD 08/29/2020, 1:48 PM   I spent a total of  40 Minutes   in face to face in clinical consultation, greater than 50% of which was counseling/coordinating care for with unexplained adenopathy as above

## 2020-08-29 NOTE — Procedures (Signed)
Interventional Radiology Procedure Note  Procedure: [place] BX RT SUPRACLAVICULAR NODE    Complications: None  Estimated Blood Loss:  MIN  Findings: 18 G CORES ON SALINE TELFA    Sharen Counter, MD

## 2020-08-29 NOTE — Discharge Instructions (Signed)
Biopsy Discharge Instructions  The procedure you just had is called a biopsy.  You may feel some discomfort after the local anesthetic wears off.  Your discomfort should improve over the next several days.  AFTER YOUR BIOPSY Rest for the remainder of the day. Avoid heavy lifting (more than 10 lb/4.5 kg). you have been given sedation medications to help you relax, you should not operate machinery, drive or make legal decisions for 24 hours after your procedure.  Additionally, someone must be available to drive you home. Only take over-the-counter or prescription medicines for pain, discomfort, or fever as directed by your caregiver.  This can make bleeding worse. You may resume your usual diet after the procedure. Avoid alcoholic beverages for 24 hours after your procedure. Keep the skin around your biopsy site clean and dry. You may shower after 24 hours.  Cleanse and dry the biopsy site completely after you shower.  Avoid baths and swimming for 72 hours.  Complications are very uncommon after this procedure.  Go to the nearest Emergency Department or contact your caregiver if you develop any of the following symptoms: Worsening pain Bleeding Swelling at the biopsy site Light headedness or dizziness Shortness of Breath Fever or chills Redness or increased pain or swelling at the biopsy site

## 2020-08-31 LAB — SURGICAL PATHOLOGY

## 2020-09-04 ENCOUNTER — Other Ambulatory Visit: Payer: Self-pay

## 2020-09-04 ENCOUNTER — Encounter: Payer: Self-pay | Admitting: Oncology

## 2020-09-04 ENCOUNTER — Inpatient Hospital Stay (HOSPITAL_BASED_OUTPATIENT_CLINIC_OR_DEPARTMENT_OTHER): Payer: No Typology Code available for payment source | Admitting: Oncology

## 2020-09-04 VITALS — BP 133/101 | HR 89 | Temp 98.4°F | Resp 16 | Ht 65.0 in | Wt 172.0 lb

## 2020-09-04 DIAGNOSIS — R591 Generalized enlarged lymph nodes: Secondary | ICD-10-CM

## 2020-09-04 DIAGNOSIS — D869 Sarcoidosis, unspecified: Secondary | ICD-10-CM | POA: Diagnosis not present

## 2020-09-04 NOTE — Progress Notes (Signed)
Hematology/Oncology follow up  note [facility] Telephone:([phone] Fax:(336) [phone]   Patient Care Team: Lorenso QuarryLeach, Shannon, NP as PCP - General (Family Medicine)  REFERRING PROVIDER: Lorenso QuarryLeach, Shannon, NP  CHIEF COMPLAINTS/REASON FOR VISIT:  Follow-up for extensive lymphadenopathy  HISTORY OF PRESENTING ILLNESS:   Mikle BosworthJasmine M Lizak is a  35 y.o.  female with PMH listed below was seen in consultation at the request of  Lorenso QuarryLeach, Shannon, NP  for evaluation of chest mass  Patient reports cough and congestion since March 2022.  She had [facility]. Result is not available to me via care everywhere.  Patient was told that there was some abnormal findings in her lungs which needs follow-up. Patient follows up with her primary care provider and a CT was obtained for further evaluation of persistent cough, congestion and shortness of breath. 08/07/2020, CT chest unfortunately showed extensive lymphadenopathy throughout chest and visualized abdomen-2 mm nodular opacity right upper lobe.-Incomplete visualization from mass arising from the mid right kidney.  1.7 x 1.5 cm.  Hepatic steatosis. Patient was referred to establish care with oncology for further evaluation.  She was accompanied by grandmother today.  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  Denies any unintentional weight loss.  Endorses night sweats for the past few months.  No fever or chills.  Other medical problems including anxiety, depression, diabetes.  INTERVAL HISTORY Mikle BosworthJasmine M Adderly is a 35 y.o. female who has above history reviewed by me today presents for follow up visit for \\extensive  lymphadenopathy. 08/29/2020, status post right neck supraclavicular core biopsy Nonnecrotizing granulomatous inflammation negative for malignancy. 08/28/2020, CT abdomen pelvis with contrast showed lymphadenopathy of the hepatoduodenal ligament measuring up to 1.7 cm.  Right kidney cyst.  No pelvic  lymphadenopathy. Patient has multiple complaints including shortness of breath, cough, night sweats, skin itchiness.  Review of Systems  Constitutional:  Negative for appetite change, chills, fatigue and fever.  HENT:   Negative for hearing loss and voice change.   Eyes:  Negative for eye problems.  Respiratory:  Positive for cough and shortness of breath. Negative for chest tightness.   Cardiovascular:  Negative for chest pain.  Gastrointestinal:  Negative for abdominal distention, abdominal pain and blood in stool.  Endocrine: Negative for hot flashes.       Night sweats  Genitourinary:  Negative for difficulty urinating and frequency.   Musculoskeletal:  Negative for arthralgias.  Skin:  Negative for itching and rash.  Neurological:  Negative for extremity weakness.  Hematological:  Negative for adenopathy.  Psychiatric/Behavioral:  Negative for confusion.    MEDICAL HISTORY:  Past Medical History:  Diagnosis Date   Anxiety    Depression    Hypertension    Pre-diabetes     SURGICAL HISTORY: Past Surgical History:  Procedure Laterality Date   NO PAST SURGERIES      SOCIAL HISTORY: Social History   Socioeconomic History   Marital status: Single    Spouse name: Not on file   Number of children: Not on file   Years of education: Not on file   Highest education level: Not on file  Occupational History   Not on file  Tobacco Use   Smoking status: Former    Packs/day: 0.50    Types: Cigarettes    Quit date: 07/2018    Years since quitting: 2.1   Smokeless tobacco: Never  Vaping Use   Vaping Use: Never used  Substance and Sexual Activity  Alcohol use: Yes    Comment: socially   Drug use: No   Sexual activity: Yes  Other Topics Concern   Not on file  Social History Narrative   Not on file   Social Determinants of Health   Financial Resource Strain: Not on file  Food Insecurity: Not on file  Transportation Needs: Not on file  Physical Activity: Not on  file  Stress: Not on file  Social Connections: Not on file  Intimate Partner Violence: Not on file    FAMILY HISTORY: Family History  Problem Relation Age of Onset   Healthy Mother     ALLERGIES:  has No Known Allergies.  MEDICATIONS:  Current Outpatient Medications  Medication Sig Dispense Refill   amLODipine (NORVASC) 5 MG tablet Take 5 mg by mouth daily.     buPROPion (WELLBUTRIN XL) 150 MG 24 hr tablet Take 150 mg by mouth daily.     fluticasone (FLONASE) 50 MCG/ACT nasal spray as needed.     hydrochlorothiazide (HYDRODIURIL) 12.5 MG tablet Take 12.5 mg by mouth daily.     traZODone (DESYREL) 100 MG tablet Take 100 mg by mouth at bedtime as needed.     venlafaxine XR (EFFEXOR-XR) 75 MG 24 hr capsule Take by mouth.     MedroxyPROGESTERone Acetate 150 MG/ML SUSY Inject 1 mL (150 mg total) as directed once. (Patient taking differently: Inject 150 mg as directed every 3 (three) months.) 1 Syringe 3   No current facility-administered medications for this visit.     PHYSICAL EXAMINATION: ECOG PERFORMANCE STATUS: 1 - Symptomatic but completely ambulatory Vitals:   09/04/20 1408  BP: (!) 133/101  Pulse: 89  Resp: 16  Temp: 98.4 F (36.9 C)  SpO2: 100%   Filed Weights   09/04/20 1408  Weight: 172 lb (78 kg)    Physical Exam Constitutional:      General: She is not in acute distress. HENT:     Head: Normocephalic and atraumatic.  Eyes:     General: No scleral icterus. Cardiovascular:     Rate and Rhythm: Normal rate and regular rhythm.     Heart sounds: Normal heart sounds.  Pulmonary:     Effort: Pulmonary effort is normal. No respiratory distress.     Breath sounds: No wheezing.  Abdominal:     General: Bowel sounds are normal. There is no distension.     Palpations: Abdomen is soft.  Musculoskeletal:        General: No deformity. Normal range of motion.     Cervical back: Normal range of motion and neck supple.  Skin:    General: Skin is warm and dry.      Findings: No erythema or rash.  Neurological:     Mental Status: She is alert and oriented to person, place, and time. Mental status is at baseline.     Cranial Nerves: No cranial nerve deficit.     Coordination: Coordination normal.  Psychiatric:        Mood and Affect: Mood normal.    LABORATORY DATA:  I have reviewed the data as listed Lab Results  Component Value Date   WBC 3.8 (L) 08/22/2020   HGB 12.9 08/22/2020   HCT 38.1 08/22/2020   MCV 87.4 08/22/2020   PLT 265 08/22/2020   Recent Labs    08/22/20 1553  NA 134*  K 3.3*  CL 102  CO2 26  GLUCOSE 89  BUN 13  CREATININE 0.69  CALCIUM 8.9  GFRNONAA >60  PROT 8.0  ALBUMIN 4.3  AST 21  ALT 15  ALKPHOS 61  BILITOT 0.5    Iron/TIBC/Ferritin/ %Sat No results found for: IRON, TIBC, FERRITIN, IRONPCTSAT    RADIOGRAPHIC STUDIES: I have personally reviewed the radiological images as listed and agreed with the findings in the report. CT CHEST W CONTRAST  Result Date: 08/07/2020 CLINICAL DATA:  Shortness of breath with cough and congestion EXAM: CT CHEST WITH CONTRAST TECHNIQUE: Multidetector CT imaging of the chest was performed during intravenous contrast administration. CONTRAST:  17mL OMNIPAQUE IOHEXOL 300 MG/ML  SOLN COMPARISON:  Report of prior chest radiograph April 23, 2020; images from that study not available currently. FINDINGS: Cardiovascular: No thoracic aortic aneurysm or dissection. Visualized great vessels appear unremarkable. Note that the left vertebral artery arises directly from the aortic arch, an anatomic variant. No evident pulmonary embolus. No pericardial effusion or pericardial thickening. Mediastinum/Nodes: Thyroid appears normal. No appreciable esophageal lesions. There is extensive multifocal adenopathy throughout the thoracic region. There are enlarged supraclavicular lymph nodes on the right, largest measuring 1.1 x 1.1 cm. Several upper normal left subclavian lymph nodes noted. There is  extensive adenopathy in the right paratracheal region. Largest individual lymph node in this area measures 3.0 x 2.8 cm. There are lymph nodes to the left of the aortic arch, largest measuring 1.8 x 1.6 cm. There is an aortopulmonary window lymph node measuring 1.5 x 1.3 cm. Adenopathy surrounds the lower trachea and carina. There is a right precarinal lymph node measuring 2.4 by 1.8 cm. A lymph node to the left of the carina measures 1.9 x 1.2 cm. Multiple enlarged subcarinal lymph nodes present. Largest individual lymph node measures 2.7 x 2.1 cm. There are lymph nodes in each hilar region. Largest right hilar lymph node measures 2.1 x 1.9 cm. Largest left hilar lymph node measures 2.2 x 1.8 cm. Lungs/Pleura: There is no appreciable edema or consolidation. Mild lower lobe atelectatic change noted. No pleural effusions. On axial slice 50 series 3, there is a 2 mm nodular opacity in the anterior segment of the right upper lobe. The trachea and major bronchial structures appear patent. No pneumothorax. Upper Abdomen: There is presumed adenopathy in the left upper abdomen located between the left kidney and pancreas. This focus of adenopathy measures 5.3 x 4.9 cm. There is retroperitoneal adenopathy located between the aorta and right kidney measuring 1.8 x 1.4 cm. Adenopathy to the right of the head of the pancreas measures 2.3 x 1.8 cm. There is hepatic steatosis. Spleen normal in size. There is incomplete visualization of a mass arising from the lateral mid right kidney measuring 1.7 x 1.5 cm. This lesion cannot be classified as a simple cyst by attenuation criteria. Musculoskeletal: No blastic or lytic bone lesions. No evident chest wall lesions. IMPRESSION: 1. Extensive multifocal adenopathy throughout the chest and visualized abdomen, almost certainly of neoplastic etiology. Etiology for this adenopathy uncertain. 2. 2 mm nodular opacity right upper lobe which potentially could represent a small neoplastic focus  given the adenopathy. No other similar nodular opacities evident. No edema or airspace consolidation. No pleural effusions. 3. Incomplete visualization of a mass arising from the mid right kidney which cannot be classified as a simple cyst. This mass measures 1.7 x 1.5 cm on this examination. Complete CT or MR through the kidneys pre and post-contrast advised to further evaluate this lesion. 4.  Hepatic steatosis. These results will be called to the ordering clinician or representative by the Radiologist Assistant, and communication documented  in the PACS or [employer]. Electronically Signed   By: Bretta Bang III M.D.   On: 08/07/2020 13:27   CT Abdomen Pelvis W Contrast  Result Date: 08/28/2020 CLINICAL DATA:  35 year old female with history of neck lymphadenopathy. EXAM: CT ABDOMEN AND PELVIS WITH CONTRAST TECHNIQUE: Multidetector CT imaging of the abdomen and pelvis was performed using the standard protocol following bolus administration of intravenous contrast. CONTRAST:  OMNIPAQUE IOHEXOL 350 MG/ML SOLN COMPARISON:  Chest CT 08/07/2020. No prior CT of the abdomen or pelvis. FINDINGS: Lower chest: Unremarkable. Hepatobiliary: No suspicious cystic or solid hepatic lesions. No intra or extrahepatic biliary ductal dilatation. Gallbladder is normal in appearance. Pancreas: No pancreatic mass. No pancreatic ductal dilatation. No pancreatic or peripancreatic fluid collections or inflammatory changes. Spleen: Normal in size and unremarkable in appearance. Adrenals/Urinary Tract: 2.1 cm low-attenuation lesion in the lateral aspect of the interpolar region of the right kidney, compatible with a simple cyst. Left kidney and bilateral adrenal glands are normal in appearance. No hydroureteronephrosis. Urinary bladder is normal in appearance. Stomach/Bowel: The appearance of the stomach is normal. No pathologic dilatation of small bowel or colon. Normal appendix. Vascular/Lymphatic: No significant  atherosclerotic disease, aneurysm or dissection noted in the abdominal or pelvic vasculature. Prominent lymph nodes in the hepatic duodenal ligament measuring up to 1.7 cm in short axis. No other lymphadenopathy noted elsewhere in the abdomen or pelvis. Reproductive: Uterus and ovaries are unremarkable in appearance. Other: No significant volume of ascites.  No pneumoperitoneum. Musculoskeletal: There are no aggressive appearing lytic or blastic lesions noted in the visualized portions of the skeleton. IMPRESSION: 1. Lymphadenopathy in the region of the hepatic duodenal ligament measuring up to 1.7 cm in short axis. Given the findings in the thorax, clinical correlation for signs and symptoms of lymphoproliferative disease is recommended. 2. No pelvic lymphadenopathy. 3. No acute findings are noted in the abdomen or pelvis. Electronically Signed   By: Trudie Reed M.D.   On: 08/28/2020 13:43   [place] CORE BIOPSY (LYMPH NODES)  Result Date: 08/29/2020 INDICATION: Right supraclavicular adenopathy, concern for lymphoma EXAM: ULTRASOUND GUIDED CORE BIOPSY OF RIGHT SUPRACLAVICULAR LYMPH NODE MEDICATIONS: 1% LIDOCAINE LOCAL ANESTHESIA/SEDATION: Versed 1.0mg  IV; Fentanyl 50.46mcg IV; Moderate Sedation Time:  11 minutes The patient was continuously monitored during the procedure by the interventional radiology nurse under my direct supervision. FLUOROSCOPY TIME:  Fluoroscopy Time: None. COMPLICATIONS: None immediate. PROCEDURE: The procedure, risks, benefits, and alternatives were explained to the patient. Questions regarding the procedure were encouraged and answered. The patient understands and consents to the procedure. Previous imaging reviewed. Preliminary ultrasound performed. The right supraclavicular enlarged lymph node was localized and marked. Under sterile conditions and local anesthesia, 18 gauge core biopsies were obtained under direct ultrasound. Images obtained for documentation. 1 cm core biopsies were  placed on a saline moistened Telfa. Approximately 7 biopsies obtained. Postprocedure imaging demonstrates no hemorrhage or hematoma. Patient tolerated the biopsy well. FINDINGS: Imaging confirms needle placed into the right supraclavicular lymph node for core biopsy IMPRESSION: Successful ultrasound right supraclavicular lymph node 18 gauge core biopsies Electronically Signed   By: Judie Petit.  Shick M.D.   On: 08/29/2020 14:19      ASSESSMENT & PLAN:  1. Lymphadenopathy   2. Sarcoidosis    #Extensive lymphadenopathy Biopsy of supraclavicular lymph node showed no malignancy.  Nonnecrotizing granulomatous inflammation Possible sarcoidosis Recommend patient to establish care with Dr. Karna Christmas. I communicated with Dr. Karna Christmas via secure chat and asked him to evaluate patient for possibility  of biopsy of chest mass/lymphadenopathy to confirm that biopsy from the supraclavicular lymph node represents her disease. Referral sent.   Night sweats, chest discomfort/shortness of breath, cough probably secondary to underlying autoimmune disease. If confirmed sarcoidosis, she will not need to follow-up with me. Orders Placed This Encounter  Procedures   Ambulatory referral to Pulmonology    Referral Priority:   Routine    Referral Type:   Consultation    Referral Reason:   Specialty Services Required    Referred to Provider:   Vida Rigger, MD    Requested Specialty:   Pulmonary Disease    Number of Visits Requested:   1    All questions were answered. The patient knows to call the clinic with any problems questions or concerns.  cc Lorenso Quarry, NP    Rickard Patience, MD, PhD Hematology Oncology [facility] Pager- [phone] 09/04/2020

## 2020-09-12 ENCOUNTER — Ambulatory Visit: Payer: No Typology Code available for payment source

## 2020-09-20 DIAGNOSIS — R590 Localized enlarged lymph nodes: Secondary | ICD-10-CM | POA: Diagnosis not present

## 2020-09-25 ENCOUNTER — Other Ambulatory Visit: Payer: Self-pay

## 2020-09-25 ENCOUNTER — Encounter
Admission: RE | Admit: 2020-09-25 | Discharge: 2020-09-25 | Disposition: A | Discharge status: Home / Self Care | Payer: No Typology Code available for payment source | Source: Ambulatory Visit | Attending: Pulmonary Disease | Admitting: Pulmonary Disease

## 2020-09-25 NOTE — Patient Instructions (Addendum)
Your procedure is scheduled on: 09/28/2020  Report to the Registration Desk on the 1st floor of the Medical Mall.  To find out your arrival time, please call [phone] between 1PM - 3PM on: 09/27/2020   REMEMBER: Instructions that are not followed completely may result in serious medical risk, up to and including death; or upon the discretion of your surgeon and anesthesiologist your surgery may need to be rescheduled.  Do not eat food after midnight the night before surgery.  No gum chewing, lozengers or hard candies.  You may however, drink CLEAR liquids up to 2 hours before you are scheduled to arrive for your surgery. Do not drink anything within 2 hours of your scheduled arrival time.  Clear liquids include: - water  - apple juice without pulp - gatorade (not RED, PURPLE, OR BLUE) - black coffee or tea (Do NOT add milk or creamers to the coffee or tea) Do NOT drink anything that is not on this list.    TAKE THESE MEDICATIONS THE MORNING OF SURGERY WITH A SIP OF WATER: amLODipine (NORVASC)  buPROPion (WELLBUTRIN XL) 3. cetirizine (ZYRTEC) as needed 4. venlafaxine XR (EFFEXOR-XR)  Use inhalers on the day of surgery and bring to the hospital.    One week prior to surgery: Stop Anti-inflammatories (NSAIDS) such as Advil, Aleve, Ibuprofen, Motrin, Naproxen, Naprosyn and Aspirin based products such as Excedrin, Goodys Powder, BC Powder. Stop ANY OVER THE COUNTER supplements until after surgery. You may however, continue to take Tylenol if needed for pain up until the day of surgery.  No Alcohol for 24 hours before or after surgery.  No Smoking including e-cigarettes for 24 hours prior to surgery.  No chewable tobacco products for at least 6 hours prior to surgery.  No nicotine patches on the day of surgery.  Do not use any "recreational" drugs for at least a week prior to your surgery.  Please be advised that the combination of cocaine and anesthesia may have negative  outcomes, up to and including death. If you test positive for cocaine, your surgery will be cancelled.  On the morning of surgery brush your teeth with toothpaste and water, you may rinse your mouth with mouthwash if you wish. Do not swallow any toothpaste or mouthwash.  Do not wear jewelry, make-up, hairpins, clips or nail polish.  Do not wear lotions, powders, or perfumes.   Do not shave body from the neck down 48 hours prior to surgery just in case you cut yourself which could leave a site for infection.  Also, freshly shaved skin may become irritated if using the CHG soap.  Contact lenses, hearing aids and dentures may not be worn into surgery.  Do not bring valuables to the hospital. [facility] is not responsible for any missing/lost belongings or valuables.    Notify your doctor if there is any change in your medical condition (cold, fever, infection).  Wear comfortable clothing (specific to your surgery type) to the hospital.  After surgery, you can help prevent lung complications by doing breathing exercises.  Take deep breaths and cough every 1-2 hours. Your doctor may order a device called an Incentive Spirometer to help you take deep breaths.  If you are being admitted to the hospital overnight, leave your suitcase in the car. After surgery it may be brought to your room.  If you are being discharged the day of surgery, you will not be allowed to drive home. You will need a responsible adult (18 years  or older) to drive you home and stay with you that night.   If you are taking public transportation, you will need to have a responsible adult (18 years or older) with you. Please confirm with your physician that it is acceptable to use public transportation.   Please call the Pre-admissions Testing Dept. at [phone] if you have any questions about these instructions.  Surgery Visitation Policy:  Patients undergoing a surgery or procedure may have one family  member or support person with them as long as that person is not COVID-19 positive or experiencing its symptoms.  That person may remain in the waiting area during the procedure.  Inpatient Visitation:    Visiting hours are 7 a.m. to 8 p.m. Inpatients will be allowed two visitors daily. The visitors may change each day during the patient's stay. No visitors under the age of 73. Any visitor under the age of 28 must be accompanied by an adult. The visitor must pass COVID-19 screenings, use hand sanitizer when entering and exiting the patient's room and wear a mask at all times, including in the patient's room. Patients must also wear a mask when staff or their visitor are in the room. Masking is required regardless of vaccination status.

## 2020-09-26 ENCOUNTER — Encounter
Admission: RE | Admit: 2020-09-26 | Discharge: 2020-09-26 | Disposition: A | Discharge status: Home / Self Care | Payer: No Typology Code available for payment source | Source: Ambulatory Visit | Attending: Pulmonary Disease | Admitting: Pulmonary Disease

## 2020-09-26 ENCOUNTER — Other Ambulatory Visit: Payer: No Typology Code available for payment source

## 2020-09-26 DIAGNOSIS — Z01818 Encounter for other preprocedural examination: Secondary | ICD-10-CM | POA: Diagnosis not present

## 2020-09-26 DIAGNOSIS — Z20822 Contact with and (suspected) exposure to covid-19: Secondary | ICD-10-CM | POA: Diagnosis not present

## 2020-09-26 DIAGNOSIS — R590 Localized enlarged lymph nodes: Secondary | ICD-10-CM | POA: Diagnosis not present

## 2020-09-26 DIAGNOSIS — Z87891 Personal history of nicotine dependence: Secondary | ICD-10-CM | POA: Diagnosis not present

## 2020-09-26 DIAGNOSIS — J8410 Pulmonary fibrosis, unspecified: Secondary | ICD-10-CM | POA: Diagnosis not present

## 2020-09-26 LAB — SARS CORONAVIRUS 2 (TAT 6-24 HRS): SARS Coronavirus 2: NEGATIVE

## 2020-09-26 LAB — BASIC METABOLIC PANEL
Anion gap: 7 (ref 5–15)
BUN: 14 mg/dL (ref 6–20)
CO2: 26 mmol/L (ref 22–32)
Calcium: 9 mg/dL (ref 8.9–10.3)
Chloride: 105 mmol/L (ref 98–111)
Creatinine, Ser: 0.95 mg/dL (ref 0.44–1.00)
GFR, Estimated: >60 mL/min (ref >60)
Glucose, Bld: 81 mg/dL (ref 70–99)
Potassium: 3.2 mmol/L — ABNORMAL LOW (ref 3.5–5.1)
Sodium: 138 mmol/L (ref 135–145)

## 2020-09-26 LAB — CBC
HCT: 37.5 % (ref 36.0–46.0)
Hemoglobin: 13.2 g/dL (ref 12.0–15.0)
MCH: 31 pg (ref 26.0–34.0)
MCHC: 35.2 g/dL (ref 30.0–36.0)
MCV: 88 fL (ref 80.0–100.0)
Platelets: 285 10*3/uL (ref 150–400)
RBC: 4.26 MIL/uL (ref 3.87–5.11)
RDW: 13.2 % (ref 11.5–15.5)
WBC: 3.9 10*3/uL — ABNORMAL LOW (ref 4.0–10.5)
nRBC: 0 % (ref 0.0–0.2)

## 2020-09-26 LAB — APTT: aPTT: 30 seconds (ref 24–36)

## 2020-09-26 LAB — PROTIME-INR
INR: 0.9 (ref 0.8–1.2)
Prothrombin Time: 12.6 seconds (ref 11.4–15.2)

## 2020-09-28 ENCOUNTER — Encounter: Payer: Self-pay | Admitting: *Deleted

## 2020-09-28 ENCOUNTER — Ambulatory Visit: Payer: No Typology Code available for payment source | Admitting: Urgent Care

## 2020-09-28 ENCOUNTER — Encounter
Admission: RE | Disposition: A | Discharge status: Home / Self Care | Payer: Self-pay | Source: Home / Self Care | Attending: Pulmonary Disease

## 2020-09-28 ENCOUNTER — Other Ambulatory Visit: Payer: Self-pay

## 2020-09-28 ENCOUNTER — Ambulatory Visit
Admission: RE | Admit: 2020-09-28 | Discharge: 2020-09-28 | Disposition: A | Discharge status: Home / Self Care | Payer: No Typology Code available for payment source | Attending: Pulmonary Disease | Admitting: Pulmonary Disease

## 2020-09-28 DIAGNOSIS — J8410 Pulmonary fibrosis, unspecified: Secondary | ICD-10-CM | POA: Insufficient documentation

## 2020-09-28 DIAGNOSIS — R222 Localized swelling, mass and lump, trunk: Secondary | ICD-10-CM | POA: Diagnosis not present

## 2020-09-28 DIAGNOSIS — Z87891 Personal history of nicotine dependence: Secondary | ICD-10-CM | POA: Diagnosis not present

## 2020-09-28 DIAGNOSIS — R590 Localized enlarged lymph nodes: Secondary | ICD-10-CM | POA: Insufficient documentation

## 2020-09-28 DIAGNOSIS — R591 Generalized enlarged lymph nodes: Secondary | ICD-10-CM | POA: Diagnosis not present

## 2020-09-28 HISTORY — PX: VIDEO BRONCHOSCOPY WITH ENDOBRONCHIAL ULTRASOUND: SHX6177

## 2020-09-28 LAB — POCT PREGNANCY, URINE: Preg Test, Ur: NEGATIVE

## 2020-09-28 SURGERY — BRONCHOSCOPY, WITH EBUS
Anesthesia: General

## 2020-09-28 MED ORDER — FENTANYL CITRATE (PF) 100 MCG/2ML IJ SOLN
INTRAMUSCULAR | Status: DC | PRN
  Administered 2020-09-28: 100 ug via INTRAVENOUS

## 2020-09-28 MED ORDER — OXYCODONE HCL 5 MG PO TABS
5.0000 mg | ORAL_TABLET | Freq: Once | ORAL | Status: DC | PRN
Start: 2020-09-28 — End: 2020-09-28

## 2020-09-28 MED ORDER — MIDAZOLAM HCL 2 MG/2ML IJ SOLN
INTRAMUSCULAR | Status: AC
  Filled 2020-09-28: qty 2

## 2020-09-28 MED ORDER — PHENYLEPHRINE HCL 0.25 % NA SOLN
1.0000 | Freq: Four times a day (QID) | NASAL | Status: DC | PRN
  Filled 2020-09-28: qty 15

## 2020-09-28 MED ORDER — PROMETHAZINE HCL 25 MG/ML IJ SOLN: 6.2500 mg | INTRAMUSCULAR | Status: DC | PRN

## 2020-09-28 MED ORDER — PROPOFOL 10 MG/ML IV BOLUS
INTRAVENOUS | Status: AC
  Filled 2020-09-28: qty 20

## 2020-09-28 MED ORDER — FENTANYL CITRATE (PF) 100 MCG/2ML IJ SOLN: 25.0000 ug | INTRAMUSCULAR | Status: DC | PRN

## 2020-09-28 MED ORDER — LIDOCAINE HCL (CARDIAC) PF 100 MG/5ML IV SOSY
PREFILLED_SYRINGE | INTRAVENOUS | Status: DC | PRN
  Administered 2020-09-28: 100 mg via INTRAVENOUS

## 2020-09-28 MED ORDER — ROCURONIUM BROMIDE 100 MG/10ML IV SOLN
INTRAVENOUS | Status: DC | PRN
  Administered 2020-09-28: 50 mg via INTRAVENOUS

## 2020-09-28 MED ORDER — MIDAZOLAM HCL 2 MG/2ML IJ SOLN
INTRAMUSCULAR | Status: DC | PRN
  Administered 2020-09-28: 2 mg via INTRAVENOUS

## 2020-09-28 MED ORDER — ALBUTEROL SULFATE HFA 108 (90 BASE) MCG/ACT IN AERS
INHALATION_SPRAY | RESPIRATORY_TRACT | Status: DC | PRN
  Administered 2020-09-28 (×2): 4 via RESPIRATORY_TRACT

## 2020-09-28 MED ORDER — DEXAMETHASONE SODIUM PHOSPHATE 10 MG/ML IJ SOLN
INTRAMUSCULAR | Status: DC | PRN
  Administered 2020-09-28: 10 mg via INTRAVENOUS

## 2020-09-28 MED ORDER — FAMOTIDINE 20 MG PO TABS: 20.0000 mg | ORAL_TABLET | Freq: Once | ORAL | Status: AC

## 2020-09-28 MED ORDER — BUTAMBEN-TETRACAINE-BENZOCAINE 2-2-14 % EX AERO
1.0000 | INHALATION_SPRAY | Freq: Once | CUTANEOUS | Status: DC
  Filled 2020-09-28: qty 20

## 2020-09-28 MED ORDER — LACTATED RINGERS IV SOLN: INTRAVENOUS | Status: DC

## 2020-09-28 MED ORDER — OXYCODONE HCL 5 MG/5ML PO SOLN
5.0000 mg | Freq: Once | ORAL | Status: DC | PRN
Start: 2020-09-28 — End: 2020-09-28

## 2020-09-28 MED ORDER — MEPERIDINE HCL 25 MG/ML IJ SOLN: 6.2500 mg | INTRAMUSCULAR | Status: DC | PRN

## 2020-09-28 MED ORDER — CHLORHEXIDINE GLUCONATE 0.12 % MT SOLN
OROMUCOSAL | Status: AC
  Administered 2020-09-28: 15 mL via OROMUCOSAL
  Filled 2020-09-28: qty 15

## 2020-09-28 MED ORDER — ORAL CARE MOUTH RINSE: 15.0000 mL | Freq: Once | OROMUCOSAL | Status: AC

## 2020-09-28 MED ORDER — FAMOTIDINE 20 MG PO TABS
ORAL_TABLET | ORAL | Status: AC
  Administered 2020-09-28: 20 mg via ORAL
  Filled 2020-09-28: qty 1

## 2020-09-28 MED ORDER — LABETALOL HCL 5 MG/ML IV SOLN
INTRAVENOUS | Status: AC
  Filled 2020-09-28: qty 4

## 2020-09-28 MED ORDER — PROPOFOL 10 MG/ML IV BOLUS
INTRAVENOUS | Status: DC | PRN
  Administered 2020-09-28: 200 mg via INTRAVENOUS

## 2020-09-28 MED ORDER — LABETALOL HCL 5 MG/ML IV SOLN
5.0000 mg | Freq: Once | INTRAVENOUS | Status: AC
  Administered 2020-09-28: 5 mg via INTRAVENOUS

## 2020-09-28 MED ORDER — ROCURONIUM BROMIDE 10 MG/ML (PF) SYRINGE
PREFILLED_SYRINGE | INTRAVENOUS | Status: AC
  Filled 2020-09-28: qty 10

## 2020-09-28 MED ORDER — FENTANYL CITRATE (PF) 100 MCG/2ML IJ SOLN
INTRAMUSCULAR | Status: AC
  Filled 2020-09-28: qty 2

## 2020-09-28 MED ORDER — CHLORHEXIDINE GLUCONATE 0.12 % MT SOLN: 15.0000 mL | Freq: Once | OROMUCOSAL | Status: AC

## 2020-09-28 MED ORDER — LIDOCAINE HCL (PF) 1 % IJ SOLN
30.0000 mL | Freq: Once | INTRAMUSCULAR | Status: DC
  Filled 2020-09-28: qty 30

## 2020-09-28 MED ORDER — ALBUTEROL SULFATE HFA 108 (90 BASE) MCG/ACT IN AERS
INHALATION_SPRAY | RESPIRATORY_TRACT | Status: AC
  Filled 2020-09-28: qty 6.7

## 2020-09-28 MED ORDER — SUGAMMADEX SODIUM 500 MG/5ML IV SOLN
INTRAVENOUS | Status: DC | PRN
Start: 2020-09-28 — End: 2020-09-28
  Administered 2020-09-28: 200 mg via INTRAVENOUS

## 2020-09-28 MED ORDER — LIDOCAINE HCL URETHRAL/MUCOSAL 2 % EX GEL
1.0000 "application " | Freq: Once | CUTANEOUS | Status: DC
  Filled 2020-09-28: qty 5

## 2020-09-28 NOTE — Discharge Instructions (Signed)
AMBULATORY SURGERY  ?DISCHARGE INSTRUCTIONS ? ? ?The drugs that you were given will stay in your system until tomorrow so for the next 24 hours you should not: ? ?Drive an automobile ?Make any legal decisions ?Drink any alcoholic beverage ? ? ?You may resume regular meals tomorrow.  Today it is better to start with liquids and gradually work up to solid foods. ? ?You may eat anything you prefer, but it is better to start with liquids, then soup and crackers, and gradually work up to solid foods. ? ? ?Please notify your doctor immediately if you have any unusual bleeding, trouble breathing, redness and pain at the surgery site, drainage, fever, or pain not relieved by medication. ? ? ? ?Additional Instructions: ? ? ? ?Please contact your physician with any problems or Same Day Surgery at [phone], Monday through Friday 6 am to 4 pm, or [place] [facility] [place] Main number at [phone].  ?

## 2020-09-28 NOTE — Anesthesia Postprocedure Evaluation (Signed)
Anesthesia Post Note  Patient: Samantha [name]  Procedure(s) Performed: VIDEO BRONCHOSCOPY WITH ENDOBRONCHIAL ULTRASOUND  Patient location during evaluation: PACU Anesthesia Type: General Level of consciousness: awake and alert and oriented Pain management: pain level controlled Vital Signs Assessment: post-procedure vital signs reviewed and stable Respiratory status: spontaneous breathing, nonlabored ventilation and respiratory function stable Cardiovascular status: blood pressure returned to baseline and stable Postop Assessment: no signs of nausea or vomiting Anesthetic complications: no   No notable events documented.   Last Vitals:  Vitals:   09/28/20 1433 09/28/20 1444  BP: (!) 135/99 (!) 119/93  Pulse: 94 88  Resp: 17 15  Temp: (!) 36.3 C (!) 36.3 C  SpO2: 96% 96%    Last Pain:  Vitals:   09/28/20 1444  TempSrc: Temporal  PainSc: 0-No pain                 Arcenio Mullaly

## 2020-09-28 NOTE — Anesthesia Preprocedure Evaluation (Signed)
Anesthesia Evaluation  Patient identified by MRN, date of birth, ID band Patient awake    Reviewed: Allergy & Precautions, NPO status , Patient's Chart, lab work & pertinent test results  History of Anesthesia Complications Negative for: history of anesthetic complications  Airway Mallampati: II  TM Distance: >3 FB Neck ROM: Full    Dental no notable dental hx.  Braces:   Pulmonary neg sleep apnea, neg COPD, former smoker,    breath sounds clear to auscultation- rhonchi (-) wheezing      Cardiovascular Exercise Tolerance: Good hypertension, Pt. on medications (-) CAD, (-) Past MI, (-) Cardiac Stents and (-) CABG  Rhythm:Regular Rate:Normal - Systolic murmurs and - Diastolic murmurs    Neuro/Psych  Headaches, neg Seizures PSYCHIATRIC DISORDERS Anxiety Depression    GI/Hepatic negative GI ROS, Neg liver ROS,   Endo/Other  negative endocrine ROSneg diabetes  Renal/GU negative Renal ROS     Musculoskeletal negative musculoskeletal ROS (+)   Abdominal (+) - obese,   Peds  Hematology negative hematology ROS (+)   Anesthesia Other Findings Past Medical History: No date: Anxiety No date: Depression No date: Hypertension No date: Pre-diabetes   Reproductive/Obstetrics                             Anesthesia Physical Anesthesia Plan  ASA: 2  Anesthesia Plan: General   Post-op Pain Management:    Induction: Intravenous  PONV Risk Score and Plan: 2 and Ondansetron, Dexamethasone and Midazolam  Airway Management Planned: Oral ETT  Additional Equipment:   Intra-op Plan:   Post-operative Plan: Extubation in OR  Informed Consent: I have reviewed the patients History and Physical, chart, labs and discussed the procedure including the risks, benefits and alternatives for the proposed anesthesia with the patient or authorized representative who has indicated his/her understanding and  acceptance.     Dental advisory given  Plan Discussed with: CRNA and Anesthesiologist  Anesthesia Plan Comments:         Anesthesia Quick Evaluation

## 2020-09-28 NOTE — Procedures (Signed)
FIBEROPTIC BRONCHOSCOPY WITH THERAPEUTIC ASPIRATION OF TRACHEOBRONCHIAL TREE AND  LAVAGE PROCEDURE NOTE  ENDOBRONCHIAL ULTRASOUND PROCEDURE NOTE    Flexible bronchoscopy was performed  by : Lanney Gins MD  assistance by : 1)Repiratory therapist  and 2)[employer] staff and 3) Anesthesia team    Indication for the procedure was :  Pre-procedural H&P. The following assessment was performed on the day of the procedure prior to initiating sedation History:  Chest pain n Dyspnea y Hemoptysis n Cough y Fever n Other pertinent items n  Examination Vital signs -reviewed as per nursing documentation today Cardiac    Murmurs: n  Rubs : n  Gallop: n Lungs Wheezing: n Rales : n Rhonchi :y  Other pertinent findings: SOB/hypoxemia due to chronic lung disease   Pre-procedural assessment for Procedural Sedation included: Depth of sedation: As per anesthesia team  ASA Classification:  2 Mallampati airway assessment: 3    Medication list reviewed: y  The patient's interval history was taken and revealed: no new complaints The pre- procedure physical examination revealed: No new findings Refer to prior clinic note for details.  Informed Consent: Informed consent was obtained from:  patient after explanation of procedure and risks, benefits, as well as alternative procedures available.  Explanation of level of sedation and possible transfusion was also provided.    Procedural Preparation: Time out was performed and patient was identified by name and birthdate and procedure to be performed and side for sampling, if any, was specified. Pt was intubated by anesthesia.  The patient was appropriately draped.   Fiberoptic bronchoscopy with airway inspection and BAL Procedure findings:  Bronchoscope was inserted via ETT  without difficulty.  Posterior oropharynx, epiglottis, arytenoids, false cords and vocal cords were not visualized as these were bypassed by endotracheal tube.  The distal trachea was normal in circumference and appearance without mucosal, cartilaginous or branching abnormalities.  The main carina was mildly splayed . All right and left lobar airways were visualized to the Subsegmental level.  Sub- sub segmental carinae were identified in all the distal airways.   Secretions were visible in the following airways and appeared to be clear.  The mucosa was : friable at RUL  Airways were notable for:        exophytic lesions :n       extrinsic compression in the following distributions: n.       Friable mucosa: y       Neurosurgeon /pigmentation: n   Media Information Document Information  Photos    09/28/2020 12:59  Attached To:  Hospital Encounter on 09/28/20   Source Information  Ottie Glazier, MD  [facility]   Media Information Document Information  Photos    09/28/2020 12:59  Attached To:  Hospital Encounter on 09/28/20  Media Information Document Information  Photos    09/28/2020 12:58  Attached To:  Hospital Encounter on 09/28/20   Source Information  Ottie Glazier, MD  [facility]   Source Information  Ottie Glazier, MD  [facility]  Media Information Document Information  Photos    09/28/2020 12:58  Attached To:  Hospital Encounter on 09/28/20   Source Information  Ottie Glazier, MD  [facility]    CYTOBRUSH X 2 DONE AT LEFT MAINSTEM BRONCHUS-  GRANULOMATA CONSISTENT WITH SARCOIDOSIS  SURGICAL PATHOLOGY FORCEPS BIOPSY X 3 AT LEFT LOWER LOBE    Post procedure Diagnosis:   COBBLESTONED MUCOSA WITH MUCUS PLUGGING CONSISTENT WITH SARCOIDOSIS       Endobronchial ultrasound assisted hilar and mediastinal lymph  node biopsies procedure findings: The fiberoptic bronchoscope was removed and the EBUS scope was introduced. Examination began to evaluate for pathologically enlarged lymph nodes starting on the LEFT side progressing to the RIGHT side.  All lymph node biopsies performed with 21G  needle. Lymph node biopsies were sent in cytolite for all stations.   Post procedure diagnosis:  BULKY LYMPHADENOPATHY CONSISTENT WITH SARCOIDOSIS   Specimens obtained included:                      Cytology brushes : LEFT MAINSTEM  Broncho-alveolar lavage site:RML   sent for CELL COUNT                              30 ml volume infused 20 ml volume returned with CELLULAR appearance   Immediate sampling complications included:NONE  Epinephrine ZERO ml was used topically  The bronchoscopy was terminated due to completion of the planned procedure and the bronchoscope was removed.   Total dosage of Lidocaine was ZERO mg Total fluoroscopy time was ZERO  minutes   Estimated Blood loss: 3cc.  Complications included:  NONE    Disposition: HOME WITH ADDITIONAL FOLLOW UP IN 1WK POST PATH REPORT  Follow up with Dr. Lanney Gins in 5 days for result discussion.     Ottie Glazier MD  [facility] Division of Pulmonary & Critical Care Medicine

## 2020-09-28 NOTE — H&P (Signed)
Pulmonary Medicine          Date: 09/28/2020,   MRN# [id number] Samantha [name] 11-17-1985     Admission                  Current    Referring physician: Dr Cathie Hoops   CHIEF COMPLAINT:   Hilar/mediastinal lymphadenopathy   HISTORY OF PRESENT ILLNESS   [name] is 35 y.o. female who was referred to Pulmonary due to significant lymphadenopathy noted on chest imaging. She was seen by primary care initially with chronic cough. Patient had CT chest 08/07/2020 which showed lymphadenopathy throughout the chest and some abdominal. She was evaluated by oncology who further noted similar lymphadenopathy and had additional evaluation by interventional radiology and subsequent biopsy of the right supraclavicular lymph node. Pathology results from right supraclavicular lymph node showed nonnecrotizing granulomatous infiltration   PAST MEDICAL HISTORY   Past Medical History:  Diagnosis Date   Anxiety    Depression    Hypertension    Pre-diabetes      SURGICAL HISTORY   Past Surgical History:  Procedure Laterality Date   NO PAST SURGERIES       FAMILY HISTORY   Family History  Problem Relation Age of Onset   Healthy Mother      SOCIAL HISTORY   Social History   Tobacco Use   Smoking status: Former    Packs/day: 0.50    Types: Cigarettes    Quit date: 07/2018    Years since quitting: 2.2   Smokeless tobacco: Never  Vaping Use   Vaping Use: Never used  Substance Use Topics   Alcohol use: Yes    Comment: socially   Drug use: No     MEDICATIONS    Home Medication:    Current Medication:  Current Facility-Administered Medications:    chlorhexidine (PERIDEX) 0.12 % solution 15 mL, 15 mL, Mouth/Throat, Once **OR** MEDLINE mouth rinse, 15 mL, Mouth Rinse, Once, Corinda Gubler, MD   famotidine (PEPCID) tablet 20 mg, 20 mg, Oral, Once, Verlee Monte, NP   lactated ringers infusion, , Intravenous, Continuous, Corinda Gubler, MD    ALLERGIES    Patient has no known allergies.     REVIEW OF SYSTEMS    Review of Systems:  Gen:  Denies  fever, sweats, chills weigh loss  HEENT: Denies blurred vision, double vision, ear pain, eye pain, hearing loss, nose bleeds, sore throat Cardiac:  No dizziness, chest pain or heaviness, chest tightness,edema Resp:   Denies cough or sputum porduction, shortness of breath,wheezing, hemoptysis,  Gi: Denies swallowing difficulty, stomach pain, nausea or vomiting, diarrhea, constipation, bowel incontinence Gu:  Denies bladder incontinence, burning urine Ext:   Denies Joint pain, stiffness or swelling Skin: Denies  skin rash, easy bruising or bleeding or hives Endoc:  Denies polyuria, polydipsia , polyphagia or weight change Psych:   Denies depression, insomnia or hallucinations   Other:  All other systems negative   VS: There were no vitals taken for this visit.     PHYSICAL EXAM    GENERAL:NAD, no fevers, chills, no weakness no fatigue HEAD: Normocephalic, atraumatic.  EYES: Pupils equal, round, reactive to light. Extraocular muscles intact. No scleral icterus.  MOUTH: Moist mucosal membrane. Dentition intact. No abscess noted.  EAR, NOSE, THROAT: Clear without exudates. No external lesions.  NECK: Supple. No thyromegaly. No nodules. No JVD.  PULMONARY: Clear to auscultation bilaterally  CARDIOVASCULAR: S1 and S2. Regular rate and rhythm. No  murmurs, rubs, or gallops. No edema. Pedal pulses 2+ bilaterally.  GASTROINTESTINAL: Soft, nontender, nondistended. No masses. Positive bowel sounds. No hepatosplenomegaly.  MUSCULOSKELETAL: No swelling, clubbing, or edema. Range of motion full in all extremities.  NEUROLOGIC: Cranial nerves II through XII are intact. No gross focal neurological deficits. Sensation intact. Reflexes intact.  SKIN: No ulceration, lesions, rashes, or cyanosis. Skin warm and dry. Turgor intact.  PSYCHIATRIC: Mood, affect within normal limits. The patient is awake,  alert and oriented x 3. Insight, judgment intact.       IMAGING    [place] CORE BIOPSY (LYMPH NODES)  Result Date: 08/29/2020 INDICATION: Right supraclavicular adenopathy, concern for lymphoma EXAM: ULTRASOUND GUIDED CORE BIOPSY OF RIGHT SUPRACLAVICULAR LYMPH NODE MEDICATIONS: 1% LIDOCAINE LOCAL ANESTHESIA/SEDATION: Versed 1.0mg  IV; Fentanyl 50.95mcg IV; Moderate Sedation Time:  11 minutes The patient was continuously monitored during the procedure by the interventional radiology nurse under my direct supervision. FLUOROSCOPY TIME:  Fluoroscopy Time: None. COMPLICATIONS: None immediate. PROCEDURE: The procedure, risks, benefits, and alternatives were explained to the patient. Questions regarding the procedure were encouraged and answered. The patient understands and consents to the procedure. Previous imaging reviewed. Preliminary ultrasound performed. The right supraclavicular enlarged lymph node was localized and marked. Under sterile conditions and local anesthesia, 18 gauge core biopsies were obtained under direct ultrasound. Images obtained for documentation. 1 cm core biopsies were placed on a saline moistened Telfa. Approximately 7 biopsies obtained. Postprocedure imaging demonstrates no hemorrhage or hematoma. Patient tolerated the biopsy well. FINDINGS: Imaging confirms needle placed into the right supraclavicular lymph node for core biopsy IMPRESSION: Successful ultrasound right supraclavicular lymph node 18 gauge core biopsies Electronically Signed   By: Judie Petit.  Shick M.D.   On: 08/29/2020 14:19      ASSESSMENT/PLAN   Hilar and mediastinal lymphadenopathy -Differential includes reactive lymphadenopathy versus possible infectious etiology versus inflammatory such as sarcoidosis vs lymphoma -Reviewed with patient in detail today -Status post supraclavicular biopsy with findings of granulomatous infiltration which sometimes can also be associated with malignancy versus sarcoidosis or other  inflammatory autoimmune condition, granulomatous findings can also be due to infectious etiology such as fungal disease. -patient admits to constitutional symptoms drenched with sweat" -patient has significant chest discomfort and dyspnea     -Reviewed risks/complications and benefits with patient, risks include infection, pneumothorax/pneumomediastinum which may require chest tube placement as well as overnight/prolonged hospitalization and possible mechanical ventilation. Other risks include bleeding and very rarely death.  Patient understands risks and wishes to proceed.  Additional questions were answered, and patient is aware that post procedure patient will be going home with family and may experience cough with possible clots on expectoration as well as phlegm which may last few days as well as hoarseness of voice post intubation and mechanical ventilation.      Thank you for allowing me to participate in the care of this patient.   Patient/Family are satisfied with care plan and all questions have been answered.  This document was prepared using Dragon voice recognition software and may include unintentional dictation errors.     Vida Rigger, M.D.  Division of Pulmonary & Critical Care Medicine  [facility]

## 2020-09-28 NOTE — Addendum Note (Signed)
Addendum  created 09/28/20 1642 by Riccardo Dubin, CRNA   Flowsheet accepted, Intraprocedure Flowsheets edited

## 2020-09-28 NOTE — Anesthesia Procedure Notes (Signed)
Procedure Name: Intubation Date/Time: 09/28/2020 12:48 PM Performed by: Riccardo Dubin, CRNA Pre-anesthesia Checklist: Patient identified, Patient being monitored, Timeout performed, Emergency Drugs available and Suction available Patient Re-evaluated:Patient Re-evaluated prior to induction Oxygen Delivery Method: Circle system utilized Preoxygenation: Pre-oxygenation with 100% oxygen Induction Type: IV induction Ventilation: Mask ventilation without difficulty and Oral airway inserted - appropriate to patient size Laryngoscope Size: McGraph Grade View: Grade I Tube type: Oral Tube size: 7.0 mm Number of attempts: 1 Airway Equipment and Method: Stylet Placement Confirmation: ETT inserted through vocal cords under direct vision, positive ETCO2 and breath sounds checked- equal and bilateral Secured at: 21 cm Tube secured with: Tape Dental Injury: Teeth and Oropharynx as per pre-operative assessment

## 2020-09-28 NOTE — Transfer of Care (Signed)
Immediate Anesthesia Transfer of Care Note  Patient: Samantha [name]  Procedure(s) Performed: VIDEO BRONCHOSCOPY WITH ENDOBRONCHIAL ULTRASOUND  Patient Location: PACU  Anesthesia Type:General  Level of Consciousness: awake  Airway & Oxygen Therapy: Patient Spontanous Breathing and Patient connected to face mask oxygen  Post-op Assessment: Report given to RN and Post -op Vital signs reviewed and stable  Post vital signs: Reviewed and stable  Last Vitals:  Vitals Value Taken Time  BP 124/96 09/28/20 1400  Temp 36.1 C 09/28/20 1354  Pulse 105 09/28/20 1406  Resp 32 09/28/20 1402  SpO2 100 % 09/28/20 1406  Vitals shown include unvalidated device data.  Last Pain:  Vitals:   09/28/20 1354  TempSrc:   PainSc: Asleep         Complications: No notable events documented.

## 2020-10-01 ENCOUNTER — Encounter: Payer: Self-pay | Admitting: Pulmonary Disease

## 2020-10-01 LAB — CYTOLOGY - NON PAP

## 2020-10-01 LAB — SURGICAL PATHOLOGY

## 2020-10-02 ENCOUNTER — Encounter: Payer: Self-pay | Admitting: Oncology

## 2020-10-02 DIAGNOSIS — Z3042 Encounter for surveillance of injectable contraceptive: Secondary | ICD-10-CM | POA: Diagnosis not present

## 2020-10-09 DIAGNOSIS — D860 Sarcoidosis of lung: Secondary | ICD-10-CM | POA: Diagnosis not present

## 2020-11-15 DIAGNOSIS — M25511 Pain in right shoulder: Secondary | ICD-10-CM | POA: Diagnosis not present

## 2020-11-15 DIAGNOSIS — M62838 Other muscle spasm: Secondary | ICD-10-CM | POA: Insufficient documentation

## 2020-11-15 DIAGNOSIS — M542 Cervicalgia: Secondary | ICD-10-CM | POA: Diagnosis not present

## 2020-11-15 DIAGNOSIS — R200 Anesthesia of skin: Secondary | ICD-10-CM | POA: Diagnosis not present

## 2020-11-15 DIAGNOSIS — R202 Paresthesia of skin: Secondary | ICD-10-CM | POA: Insufficient documentation

## 2020-11-20 ENCOUNTER — Other Ambulatory Visit: Payer: Self-pay | Admitting: Neurology

## 2020-11-20 ENCOUNTER — Other Ambulatory Visit (HOSPITAL_COMMUNITY): Payer: Self-pay | Admitting: Neurology

## 2020-11-20 DIAGNOSIS — R202 Paresthesia of skin: Secondary | ICD-10-CM

## 2020-11-20 DIAGNOSIS — M62838 Other muscle spasm: Secondary | ICD-10-CM

## 2020-11-20 DIAGNOSIS — R251 Tremor, unspecified: Secondary | ICD-10-CM

## 2020-11-20 DIAGNOSIS — R200 Anesthesia of skin: Secondary | ICD-10-CM

## 2020-11-21 DIAGNOSIS — D869 Sarcoidosis, unspecified: Secondary | ICD-10-CM | POA: Diagnosis not present

## 2020-12-18 DIAGNOSIS — Z3042 Encounter for surveillance of injectable contraceptive: Secondary | ICD-10-CM | POA: Diagnosis not present

## 2020-12-20 DIAGNOSIS — R200 Anesthesia of skin: Secondary | ICD-10-CM | POA: Diagnosis not present

## 2020-12-20 DIAGNOSIS — M62838 Other muscle spasm: Secondary | ICD-10-CM | POA: Diagnosis not present

## 2020-12-20 DIAGNOSIS — R251 Tremor, unspecified: Secondary | ICD-10-CM | POA: Diagnosis not present

## 2020-12-20 DIAGNOSIS — M25511 Pain in right shoulder: Secondary | ICD-10-CM | POA: Diagnosis not present

## 2021-01-07 DIAGNOSIS — M791 Myalgia, unspecified site: Secondary | ICD-10-CM | POA: Diagnosis not present

## 2021-01-07 DIAGNOSIS — Z23 Encounter for immunization: Secondary | ICD-10-CM | POA: Diagnosis not present

## 2021-01-07 DIAGNOSIS — R682 Dry mouth, unspecified: Secondary | ICD-10-CM | POA: Diagnosis not present

## 2021-01-07 DIAGNOSIS — R110 Nausea: Secondary | ICD-10-CM | POA: Insufficient documentation

## 2021-01-07 DIAGNOSIS — H04123 Dry eye syndrome of bilateral lacrimal glands: Secondary | ICD-10-CM | POA: Insufficient documentation

## 2021-01-07 DIAGNOSIS — Z1329 Encounter for screening for other suspected endocrine disorder: Secondary | ICD-10-CM | POA: Diagnosis not present

## 2021-01-07 DIAGNOSIS — R200 Anesthesia of skin: Secondary | ICD-10-CM | POA: Diagnosis not present

## 2021-01-07 DIAGNOSIS — K130 Diseases of lips: Secondary | ICD-10-CM | POA: Diagnosis not present

## 2021-01-07 DIAGNOSIS — D860 Sarcoidosis of lung: Secondary | ICD-10-CM | POA: Insufficient documentation

## 2021-01-07 DIAGNOSIS — R7303 Prediabetes: Secondary | ICD-10-CM | POA: Diagnosis not present

## 2021-01-14 ENCOUNTER — Ambulatory Visit
Admission: RE | Admit: 2021-01-14 | Discharge: 2021-01-14 | Disposition: A | Discharge status: Home / Self Care | Payer: No Typology Code available for payment source | Source: Ambulatory Visit | Attending: Neurology | Admitting: Neurology

## 2021-01-14 ENCOUNTER — Other Ambulatory Visit: Payer: Self-pay

## 2021-01-14 DIAGNOSIS — R200 Anesthesia of skin: Secondary | ICD-10-CM | POA: Insufficient documentation

## 2021-01-14 DIAGNOSIS — M62838 Other muscle spasm: Secondary | ICD-10-CM | POA: Diagnosis not present

## 2021-01-14 DIAGNOSIS — R251 Tremor, unspecified: Secondary | ICD-10-CM | POA: Insufficient documentation

## 2021-01-14 DIAGNOSIS — R202 Paresthesia of skin: Secondary | ICD-10-CM | POA: Diagnosis not present

## 2021-01-17 DIAGNOSIS — Z01818 Encounter for other preprocedural examination: Secondary | ICD-10-CM | POA: Diagnosis not present

## 2021-01-17 DIAGNOSIS — D860 Sarcoidosis of lung: Secondary | ICD-10-CM | POA: Diagnosis not present

## 2021-01-22 DIAGNOSIS — M542 Cervicalgia: Secondary | ICD-10-CM | POA: Diagnosis not present

## 2021-01-22 DIAGNOSIS — R200 Anesthesia of skin: Secondary | ICD-10-CM | POA: Diagnosis not present

## 2021-01-22 DIAGNOSIS — R202 Paresthesia of skin: Secondary | ICD-10-CM | POA: Diagnosis not present

## 2021-01-22 DIAGNOSIS — M62838 Other muscle spasm: Secondary | ICD-10-CM | POA: Diagnosis not present

## 2021-01-28 DIAGNOSIS — J019 Acute sinusitis, unspecified: Secondary | ICD-10-CM | POA: Diagnosis not present

## 2021-01-28 DIAGNOSIS — Z03818 Encounter for observation for suspected exposure to other biological agents ruled out: Secondary | ICD-10-CM | POA: Diagnosis not present

## 2021-01-28 DIAGNOSIS — J209 Acute bronchitis, unspecified: Secondary | ICD-10-CM | POA: Diagnosis not present

## 2021-01-28 DIAGNOSIS — U071 COVID-19: Secondary | ICD-10-CM | POA: Diagnosis not present

## 2021-01-28 DIAGNOSIS — D860 Sarcoidosis of lung: Secondary | ICD-10-CM | POA: Diagnosis not present

## 2021-02-07 ENCOUNTER — Other Ambulatory Visit
Admission: RE | Admit: 2021-02-07 | Discharge: 2021-02-07 | Disposition: A | Discharge status: Home / Self Care | Payer: No Typology Code available for payment source | Source: Ambulatory Visit | Attending: Pulmonary Disease | Admitting: Pulmonary Disease

## 2021-02-07 DIAGNOSIS — U071 COVID-19: Secondary | ICD-10-CM | POA: Diagnosis not present

## 2021-02-07 DIAGNOSIS — R69 Illness, unspecified: Secondary | ICD-10-CM | POA: Diagnosis not present

## 2021-02-07 LAB — D-DIMER, QUANTITATIVE: D-Dimer, Quant: <0.27 ug/mL-FEU (ref 0.00–0.50)

## 2021-02-14 DIAGNOSIS — I10 Essential (primary) hypertension: Secondary | ICD-10-CM | POA: Diagnosis not present

## 2021-02-14 DIAGNOSIS — Z01419 Encounter for gynecological examination (general) (routine) without abnormal findings: Secondary | ICD-10-CM | POA: Diagnosis not present

## 2021-02-21 DIAGNOSIS — D8686 Sarcoid arthropathy: Secondary | ICD-10-CM | POA: Diagnosis not present

## 2021-02-21 DIAGNOSIS — D869 Sarcoidosis, unspecified: Secondary | ICD-10-CM | POA: Diagnosis not present

## 2021-02-21 DIAGNOSIS — D8689 Sarcoidosis of other sites: Secondary | ICD-10-CM | POA: Diagnosis not present

## 2021-02-21 DIAGNOSIS — D860 Sarcoidosis of lung: Secondary | ICD-10-CM | POA: Diagnosis not present

## 2021-03-04 DIAGNOSIS — Z03818 Encounter for observation for suspected exposure to other biological agents ruled out: Secondary | ICD-10-CM | POA: Diagnosis not present

## 2021-03-04 DIAGNOSIS — J40 Bronchitis, not specified as acute or chronic: Secondary | ICD-10-CM | POA: Diagnosis not present

## 2021-04-08 DIAGNOSIS — J019 Acute sinusitis, unspecified: Secondary | ICD-10-CM | POA: Diagnosis not present

## 2021-04-08 DIAGNOSIS — J029 Acute pharyngitis, unspecified: Secondary | ICD-10-CM | POA: Diagnosis not present

## 2021-04-08 DIAGNOSIS — J209 Acute bronchitis, unspecified: Secondary | ICD-10-CM | POA: Diagnosis not present

## 2021-04-08 DIAGNOSIS — Z03818 Encounter for observation for suspected exposure to other biological agents ruled out: Secondary | ICD-10-CM | POA: Diagnosis not present

## 2021-04-09 DIAGNOSIS — M62838 Other muscle spasm: Secondary | ICD-10-CM | POA: Diagnosis not present

## 2021-04-09 DIAGNOSIS — M542 Cervicalgia: Secondary | ICD-10-CM | POA: Diagnosis not present

## 2021-04-09 DIAGNOSIS — R200 Anesthesia of skin: Secondary | ICD-10-CM | POA: Diagnosis not present

## 2021-04-09 DIAGNOSIS — R69 Illness, unspecified: Secondary | ICD-10-CM | POA: Diagnosis not present

## 2021-04-22 DIAGNOSIS — D869 Sarcoidosis, unspecified: Secondary | ICD-10-CM | POA: Diagnosis not present

## 2021-04-22 DIAGNOSIS — D8686 Sarcoid arthropathy: Secondary | ICD-10-CM | POA: Diagnosis not present

## 2021-04-22 DIAGNOSIS — D8689 Sarcoidosis of other sites: Secondary | ICD-10-CM | POA: Diagnosis not present

## 2021-04-22 DIAGNOSIS — D860 Sarcoidosis of lung: Secondary | ICD-10-CM | POA: Diagnosis not present

## 2021-04-25 DIAGNOSIS — D860 Sarcoidosis of lung: Secondary | ICD-10-CM | POA: Diagnosis not present

## 2021-05-29 DIAGNOSIS — R200 Anesthesia of skin: Secondary | ICD-10-CM | POA: Diagnosis not present

## 2021-07-09 DIAGNOSIS — E559 Vitamin D deficiency, unspecified: Secondary | ICD-10-CM | POA: Insufficient documentation

## 2021-07-09 DIAGNOSIS — G43909 Migraine, unspecified, not intractable, without status migrainosus: Secondary | ICD-10-CM | POA: Diagnosis not present

## 2021-07-09 DIAGNOSIS — Z23 Encounter for immunization: Secondary | ICD-10-CM | POA: Diagnosis not present

## 2021-07-09 DIAGNOSIS — D860 Sarcoidosis of lung: Secondary | ICD-10-CM | POA: Diagnosis not present

## 2021-07-09 DIAGNOSIS — H04123 Dry eye syndrome of bilateral lacrimal glands: Secondary | ICD-10-CM | POA: Diagnosis not present

## 2021-07-09 DIAGNOSIS — I10 Essential (primary) hypertension: Secondary | ICD-10-CM | POA: Diagnosis not present

## 2021-07-09 DIAGNOSIS — R69 Illness, unspecified: Secondary | ICD-10-CM | POA: Diagnosis not present

## 2021-07-09 DIAGNOSIS — Z09 Encounter for follow-up examination after completed treatment for conditions other than malignant neoplasm: Secondary | ICD-10-CM | POA: Diagnosis not present

## 2021-07-15 DIAGNOSIS — D860 Sarcoidosis of lung: Secondary | ICD-10-CM | POA: Diagnosis not present

## 2021-07-26 DIAGNOSIS — D862 Sarcoidosis of lung with sarcoidosis of lymph nodes: Secondary | ICD-10-CM | POA: Diagnosis not present

## 2021-07-30 DIAGNOSIS — R200 Anesthesia of skin: Secondary | ICD-10-CM | POA: Diagnosis not present

## 2021-07-30 DIAGNOSIS — R251 Tremor, unspecified: Secondary | ICD-10-CM | POA: Diagnosis not present

## 2021-07-30 DIAGNOSIS — M25512 Pain in left shoulder: Secondary | ICD-10-CM | POA: Diagnosis not present

## 2021-07-30 DIAGNOSIS — M542 Cervicalgia: Secondary | ICD-10-CM | POA: Diagnosis not present

## 2021-07-30 DIAGNOSIS — M25511 Pain in right shoulder: Secondary | ICD-10-CM | POA: Diagnosis not present

## 2021-07-30 DIAGNOSIS — R202 Paresthesia of skin: Secondary | ICD-10-CM | POA: Diagnosis not present

## 2021-07-30 DIAGNOSIS — R69 Illness, unspecified: Secondary | ICD-10-CM | POA: Diagnosis not present

## 2021-07-30 DIAGNOSIS — M2550 Pain in unspecified joint: Secondary | ICD-10-CM | POA: Diagnosis not present

## 2021-07-30 DIAGNOSIS — M62838 Other muscle spasm: Secondary | ICD-10-CM | POA: Diagnosis not present

## 2021-07-31 DIAGNOSIS — D8686 Sarcoid arthropathy: Secondary | ICD-10-CM | POA: Diagnosis not present

## 2021-07-31 DIAGNOSIS — D8689 Sarcoidosis of other sites: Secondary | ICD-10-CM | POA: Diagnosis not present

## 2021-07-31 DIAGNOSIS — D869 Sarcoidosis, unspecified: Secondary | ICD-10-CM | POA: Diagnosis not present

## 2021-07-31 DIAGNOSIS — D860 Sarcoidosis of lung: Secondary | ICD-10-CM | POA: Diagnosis not present

## 2021-08-14 ENCOUNTER — Other Ambulatory Visit: Payer: Self-pay | Admitting: Pulmonary Disease

## 2021-08-14 DIAGNOSIS — D862 Sarcoidosis of lung with sarcoidosis of lymph nodes: Secondary | ICD-10-CM

## 2021-08-28 DIAGNOSIS — D860 Sarcoidosis of lung: Secondary | ICD-10-CM | POA: Diagnosis not present

## 2021-09-01 DIAGNOSIS — G4733 Obstructive sleep apnea (adult) (pediatric): Secondary | ICD-10-CM | POA: Diagnosis not present

## 2021-09-05 ENCOUNTER — Encounter (HOSPITAL_COMMUNITY): Payer: Self-pay

## 2021-09-05 ENCOUNTER — Telehealth (HOSPITAL_COMMUNITY): Payer: Self-pay | Admitting: *Deleted

## 2021-09-05 NOTE — Telephone Encounter (Signed)
Attempted to call patient regarding upcoming cardiac MRI appointment. Left message on voicemail with name and callback number  Elber Galyean RN Navigator Cardiac Imaging [facility] [phone] Office [phone] Cell  

## 2021-09-06 ENCOUNTER — Ambulatory Visit
Admission: RE | Admit: 2021-09-06 | Discharge: 2021-09-06 | Disposition: A | Discharge status: Home / Self Care | Payer: 59 | Source: Ambulatory Visit | Attending: Pulmonary Disease | Admitting: Pulmonary Disease

## 2021-09-06 DIAGNOSIS — D862 Sarcoidosis of lung with sarcoidosis of lymph nodes: Secondary | ICD-10-CM | POA: Diagnosis not present

## 2021-09-06 MED ORDER — GADOBUTROL 1 MMOL/ML IV SOLN
10.0000 mL | Freq: Once | INTRAVENOUS | Status: AC | PRN
  Administered 2021-09-06: 10 mL via INTRAVENOUS

## 2021-09-12 DIAGNOSIS — M542 Cervicalgia: Secondary | ICD-10-CM | POA: Diagnosis not present

## 2021-09-12 DIAGNOSIS — R202 Paresthesia of skin: Secondary | ICD-10-CM | POA: Diagnosis not present

## 2021-09-12 DIAGNOSIS — R251 Tremor, unspecified: Secondary | ICD-10-CM | POA: Diagnosis not present

## 2021-09-12 DIAGNOSIS — M25511 Pain in right shoulder: Secondary | ICD-10-CM | POA: Diagnosis not present

## 2021-09-12 DIAGNOSIS — R200 Anesthesia of skin: Secondary | ICD-10-CM | POA: Diagnosis not present

## 2021-09-12 DIAGNOSIS — M62838 Other muscle spasm: Secondary | ICD-10-CM | POA: Diagnosis not present

## 2021-09-12 DIAGNOSIS — M2550 Pain in unspecified joint: Secondary | ICD-10-CM | POA: Diagnosis not present

## 2021-09-12 DIAGNOSIS — M25512 Pain in left shoulder: Secondary | ICD-10-CM | POA: Diagnosis not present

## 2021-09-12 DIAGNOSIS — R69 Illness, unspecified: Secondary | ICD-10-CM | POA: Diagnosis not present

## 2021-09-12 DIAGNOSIS — G4733 Obstructive sleep apnea (adult) (pediatric): Secondary | ICD-10-CM | POA: Diagnosis not present

## 2021-10-02 DIAGNOSIS — D860 Sarcoidosis of lung: Secondary | ICD-10-CM | POA: Diagnosis not present

## 2021-10-02 DIAGNOSIS — D8689 Sarcoidosis of other sites: Secondary | ICD-10-CM | POA: Diagnosis not present

## 2021-10-02 DIAGNOSIS — D869 Sarcoidosis, unspecified: Secondary | ICD-10-CM | POA: Diagnosis not present

## 2021-10-02 DIAGNOSIS — D8686 Sarcoid arthropathy: Secondary | ICD-10-CM | POA: Diagnosis not present

## 2021-10-08 DIAGNOSIS — E661 Drug-induced obesity: Secondary | ICD-10-CM | POA: Insufficient documentation

## 2021-10-13 DIAGNOSIS — G4733 Obstructive sleep apnea (adult) (pediatric): Secondary | ICD-10-CM | POA: Diagnosis not present

## 2021-10-25 ENCOUNTER — Other Ambulatory Visit: Payer: Self-pay | Admitting: Pulmonary Disease

## 2021-10-25 DIAGNOSIS — R109 Unspecified abdominal pain: Secondary | ICD-10-CM | POA: Diagnosis not present

## 2021-10-28 DIAGNOSIS — D860 Sarcoidosis of lung: Secondary | ICD-10-CM | POA: Diagnosis not present

## 2021-10-31 ENCOUNTER — Ambulatory Visit
Admission: RE | Admit: 2021-10-31 | Discharge: 2021-10-31 | Disposition: A | Discharge status: Home / Self Care | Payer: 59 | Source: Ambulatory Visit | Attending: Pulmonary Disease | Admitting: Pulmonary Disease

## 2021-10-31 DIAGNOSIS — N281 Cyst of kidney, acquired: Secondary | ICD-10-CM | POA: Diagnosis not present

## 2021-10-31 DIAGNOSIS — R109 Unspecified abdominal pain: Secondary | ICD-10-CM

## 2021-11-12 DIAGNOSIS — G4733 Obstructive sleep apnea (adult) (pediatric): Secondary | ICD-10-CM | POA: Diagnosis not present

## 2021-11-28 ENCOUNTER — Other Ambulatory Visit
Admission: RE | Admit: 2021-11-28 | Discharge: 2021-11-28 | Disposition: A | Discharge status: Home / Self Care | Payer: 59 | Source: Ambulatory Visit | Attending: Rheumatology | Admitting: Rheumatology

## 2021-11-28 DIAGNOSIS — R079 Chest pain, unspecified: Secondary | ICD-10-CM | POA: Diagnosis not present

## 2021-11-28 DIAGNOSIS — D860 Sarcoidosis of lung: Secondary | ICD-10-CM | POA: Insufficient documentation

## 2021-11-28 DIAGNOSIS — R0602 Shortness of breath: Secondary | ICD-10-CM | POA: Diagnosis not present

## 2021-11-28 DIAGNOSIS — Z03818 Encounter for observation for suspected exposure to other biological agents ruled out: Secondary | ICD-10-CM | POA: Diagnosis not present

## 2021-11-28 DIAGNOSIS — D869 Sarcoidosis, unspecified: Secondary | ICD-10-CM | POA: Diagnosis not present

## 2021-11-28 LAB — TROPONIN I (HIGH SENSITIVITY): Troponin I (High Sensitivity): <2 ng/L (ref <18)

## 2021-12-13 DIAGNOSIS — G4733 Obstructive sleep apnea (adult) (pediatric): Secondary | ICD-10-CM | POA: Diagnosis not present

## 2021-12-14 DIAGNOSIS — G4733 Obstructive sleep apnea (adult) (pediatric): Secondary | ICD-10-CM | POA: Diagnosis not present

## 2021-12-23 DIAGNOSIS — D860 Sarcoidosis of lung: Secondary | ICD-10-CM | POA: Diagnosis not present

## 2021-12-26 ENCOUNTER — Other Ambulatory Visit: Payer: Self-pay

## 2021-12-26 ENCOUNTER — Emergency Department
Admission: EM | Admit: 2021-12-26 | Discharge: 2021-12-26 | Disposition: A | Discharge status: Home / Self Care | Payer: 59 | Attending: Emergency Medicine | Admitting: Emergency Medicine

## 2021-12-26 ENCOUNTER — Emergency Department: Payer: 59

## 2021-12-26 ENCOUNTER — Encounter: Payer: Self-pay | Admitting: Emergency Medicine

## 2021-12-26 DIAGNOSIS — B349 Viral infection, unspecified: Secondary | ICD-10-CM | POA: Insufficient documentation

## 2021-12-26 DIAGNOSIS — E876 Hypokalemia: Secondary | ICD-10-CM | POA: Insufficient documentation

## 2021-12-26 DIAGNOSIS — Z1152 Encounter for screening for COVID-19: Secondary | ICD-10-CM | POA: Diagnosis not present

## 2021-12-26 DIAGNOSIS — I10 Essential (primary) hypertension: Secondary | ICD-10-CM | POA: Diagnosis not present

## 2021-12-26 DIAGNOSIS — R0789 Other chest pain: Secondary | ICD-10-CM | POA: Insufficient documentation

## 2021-12-26 DIAGNOSIS — R079 Chest pain, unspecified: Secondary | ICD-10-CM | POA: Diagnosis not present

## 2021-12-26 DIAGNOSIS — M791 Myalgia, unspecified site: Secondary | ICD-10-CM | POA: Diagnosis present

## 2021-12-26 DIAGNOSIS — Z79899 Other long term (current) drug therapy: Secondary | ICD-10-CM | POA: Diagnosis not present

## 2021-12-26 LAB — CBC
HCT: 38.5 % (ref 36.0–46.0)
Hemoglobin: 13.1 g/dL (ref 12.0–15.0)
MCH: 30.4 pg (ref 26.0–34.0)
MCHC: 34 g/dL (ref 30.0–36.0)
MCV: 89.3 fL (ref 80.0–100.0)
Platelets: 324 10*3/uL (ref 150–400)
RBC: 4.31 MIL/uL (ref 3.87–5.11)
RDW: 13.2 % (ref 11.5–15.5)
WBC: 6.8 10*3/uL (ref 4.0–10.5)
nRBC: 0 % (ref 0.0–0.2)

## 2021-12-26 LAB — BASIC METABOLIC PANEL
Anion gap: 9 (ref 5–15)
BUN: 10 mg/dL (ref 6–20)
CO2: 25 mmol/L (ref 22–32)
Calcium: 9.3 mg/dL (ref 8.9–10.3)
Chloride: 105 mmol/L (ref 98–111)
Creatinine, Ser: 0.85 mg/dL (ref 0.44–1.00)
GFR, Estimated: >60 mL/min (ref >60)
Glucose, Bld: 97 mg/dL (ref 70–99)
Potassium: 2.8 mmol/L — ABNORMAL LOW (ref 3.5–5.1)
Sodium: 139 mmol/L (ref 135–145)

## 2021-12-26 LAB — TROPONIN I (HIGH SENSITIVITY): Troponin I (High Sensitivity): 3 ng/L (ref <18)

## 2021-12-26 LAB — RESP PANEL BY RT-PCR (FLU A&B, COVID) ARPGX2
Influenza A by PCR: NEGATIVE
Influenza B by PCR: NEGATIVE
SARS Coronavirus 2 by RT PCR: NEGATIVE

## 2021-12-26 MED ORDER — POTASSIUM CHLORIDE CRYS ER 20 MEQ PO TBCR
40.0000 meq | EXTENDED_RELEASE_TABLET | Freq: Once | ORAL | Status: AC
  Administered 2021-12-26: 40 meq via ORAL
  Filled 2021-12-26: qty 2

## 2021-12-26 MED ORDER — POTASSIUM CHLORIDE CRYS ER 20 MEQ PO TBCR: 20.0000 meq | EXTENDED_RELEASE_TABLET | Freq: Three times a day (TID) | ORAL | 0 refills | Status: DC

## 2021-12-26 NOTE — ED Notes (Signed)
See triage note  Presents with some body aches and low grade temp.  Also some discomfort to chest  States sx's started after getting a Remicaid  Infusion

## 2021-12-26 NOTE — ED Notes (Signed)
Pt signed esignature.  D/c  inst to pt.  

## 2021-12-26 NOTE — ED Triage Notes (Signed)
Pt to ED for chest pain, body aches and headaches for the past couple days, worsening today. Had rhemicaid infusion Monday with dose increased, started feeling bad after infusion, rheumatologist called in predisone but continuing to feel bad.  Pt has pulmonary sarcoidosis

## 2021-12-26 NOTE — ED Provider Notes (Signed)
[facility] EMERGENCY DEPARTMENT Provider Note   CSN: [id number] Arrival date & time: 12/26/21  1647     History  Chief Complaint  Patient presents with   Chest Pain    Samantha [name] is a 36 y.o. female presents to the emergency department for evaluation of body aches, low-grade temp, chills and chest discomfort.  She states the symptoms occurred Monday, 4 days ago after receiving her Remicade infusion.  She has had the symptoms before with Remicade.  She took prednisone prescribed by rheumatologist which did help with symptoms but today symptoms return.  Patient describes intermittent dull chest pain with no shortness of breath.  Chest pain present with cough.  Cough nonproductive.  She also describes diffuse body aches, chills with mild congestion and no cough.  No known fevers.  No nausea or vomiting.  No abdominal pain.  HPI     Home Medications Prior to Admission medications   Medication Sig Start Date End Date Taking? Authorizing Provider  albuterol (VENTOLIN HFA) 108 (90 Base) MCG/ACT inhaler Inhale 2 puffs into the lungs every 6 (six) hours as needed for wheezing or shortness of breath.    [provider]  amLODipine (NORVASC) 5 MG tablet Take 5 mg by mouth daily. 07/04/20   [provider]  buPROPion (WELLBUTRIN XL) 150 MG 24 hr tablet Take 150 mg by mouth daily. 07/04/20   [provider]  cetirizine (ZYRTEC) 10 MG tablet Take 10 mg by mouth daily as needed for allergies.    [provider]  fluticasone (FLONASE) 50 MCG/ACT nasal spray Place 1 spray into both nostrils daily as needed for allergies. 04/16/18   [provider]  hydrochlorothiazide (HYDRODIURIL) 12.5 MG tablet Take 12.5 mg by mouth daily. 03/20/19   [provider]  hydrOXYzine (ATARAX/VISTARIL) 25 MG tablet Take 25 mg by mouth at bedtime. 09/05/20   [provider]  MedroxyPROGESTERone Acetate 150 MG/ML SUSY Inject 1 mL (150 mg  total) as directed once. Patient taking differently: Inject 150 mg as directed every 3 (three) months. 10/08/16 99991111  Copland, Elmo Putt B, PA-C  potassium chloride SA (KLOR-CON M) 20 MEQ tablet Take 1 tablet (20 mEq total) by mouth 3 (three) times daily. 12/26/21  Yes Duanne Guess, PA-C  traZODone (DESYREL) 100 MG tablet Take 100 mg by mouth at bedtime. 08/30/20   [provider]  venlafaxine XR (EFFEXOR-XR) 150 MG 24 hr capsule Take 150 mg by mouth daily with breakfast.    [provider]      Allergies    Patient has no known allergies.    Review of Systems   Review of Systems  Physical Exam Updated Vital Signs BP (!) 151/112 (BP Location: Left Arm) Comment: pt did not not take BP meds this AM  Pulse 88   Temp 99 F (37.2 C) (Oral)   Resp 17   Ht 5\' 5"  (1.651 m)   Wt 93 kg   SpO2 100%   BMI 34.11 kg/m  Physical Exam Constitutional:      General: She is not in acute distress.    Appearance: She is well-developed.  HENT:     Head: Normocephalic and atraumatic.     Nose: Nose normal.     Mouth/Throat:     Mouth: Mucous membranes are moist.     Pharynx: Oropharynx is clear. No oropharyngeal exudate or posterior oropharyngeal erythema.  Eyes:     General:  Right eye: No discharge.        Left eye: No discharge.     Pupils: Pupils are equal, round, and reactive to light.  Neck:     Vascular: No carotid bruit.  Cardiovascular:     Rate and Rhythm: Normal rate and regular rhythm.     Pulses: Normal pulses.     Heart sounds: Normal heart sounds.     Comments: Mild chest wall tenderness to palpation along mid sternum.  No swelling or bruising. Pulmonary:     Effort: Pulmonary effort is normal. No respiratory distress.     Breath sounds: Normal breath sounds.  Chest:     Chest wall: No tenderness.  Abdominal:     General: Bowel sounds are normal. There is no distension or abdominal bruit.     Palpations: Abdomen is soft. There is no hepatomegaly  or mass.     Tenderness: There is no abdominal tenderness. There is no guarding.  Musculoskeletal:        General: Normal range of motion.     Cervical back: Normal range of motion and neck supple. No rigidity or tenderness.  Lymphadenopathy:     Cervical: Cervical adenopathy (posterior cervical lymphadenopathy present) present.  Skin:    General: Skin is warm and dry.  Neurological:     Mental Status: She is alert and oriented to person, place, and time.     Deep Tendon Reflexes: Reflexes are normal and symmetric.  Psychiatric:        Behavior: Behavior normal.        Thought Content: Thought content normal.     ED Results / Procedures / Treatments   Labs (all labs ordered are listed, but only abnormal results are displayed) Labs Reviewed  BASIC METABOLIC PANEL - Abnormal; Notable for the following components:      Result Value   Potassium 2.8 (*)    All other components within normal limits  RESP PANEL BY RT-PCR (FLU A&B, COVID) ARPGX2  CBC  POC URINE PREG, ED  TROPONIN I (HIGH SENSITIVITY)  TROPONIN I (HIGH SENSITIVITY)    EKG None  Radiology DG Chest 2 View  Result Date: 12/26/2021 CLINICAL DATA:  Chest pain. EXAM: CHEST - 2 VIEW COMPARISON:  None Available. FINDINGS: The heart size and mediastinal contours are within normal limits. Both lungs are clear. The visualized skeletal structures are unremarkable. IMPRESSION: No active cardiopulmonary disease. Electronically Signed   By: Larose Hires D.O.   On: 12/26/2021 17:23    Procedures Procedures    Medications Ordered in ED Medications  potassium chloride SA (KLOR-CON M) CR tablet 40 mEq (has no administration in time range)    ED Course/ Medical Decision Making/ A&P                           Medical Decision Making Amount and/or Complexity of Data Reviewed Labs: ordered. Radiology: ordered.  Risk Prescription drug management.   36 year old female with history of hypertension, sarcoidosis presents to  the emergency department for evaluation of intermittent chest pain, body aches, chills and a low-grade temp.  She has had mild cough and congestion.  No shortness of breath and currently without chest pain.  She describes chest pain as mild, dull and intermittent.  Pain with coughing.  Patient with normal CBC, troponin, chest x-ray and EKG.  She was noted to have hypokalemia with potassium of 2.8.  Previous potassium levels reviewed showing a range  of 3-3.5.  Patient is on 2 diuretic blood pressure medications.  Patient given 1 dose of potassium here in the emergency department and given a prescription for low-dose potassium to take over the next few days.  A flu and COVID test is pending.  Patient does seem to be having new onset cold symptoms, this could explain her prodromal symptoms.  Patient will continue with Tylenol and Aleve at home.  She understands signs and symptoms return to the ER for.  She will call PCP to schedule follow-up. Final Clinical Impression(s) / ED Diagnoses Final diagnoses:  Atypical chest pain  Viral illness  Hypokalemia    Rx / DC Orders ED Discharge Orders          Ordered    potassium chloride SA (KLOR-CON M) 20 MEQ tablet  3 times daily        12/26/21 1954              Renata Caprice 12/26/21 2008    Nathaniel Man, MD 12/29/21 [phone]

## 2021-12-26 NOTE — Discharge Instructions (Addendum)
Please continue with Tylenol as needed for body aches.  You may take over-the-counter cough and cold medication as needed for congestion runny nose or cough.  Take potassium supplement daily as prescribed.  Call primary care provider to schedule follow-up appointment.  Return to the ER for any worsening symptoms or any urgent changes in health

## 2022-01-12 DIAGNOSIS — G4733 Obstructive sleep apnea (adult) (pediatric): Secondary | ICD-10-CM | POA: Diagnosis not present

## 2022-01-13 DIAGNOSIS — G4733 Obstructive sleep apnea (adult) (pediatric): Secondary | ICD-10-CM | POA: Diagnosis not present

## 2022-01-14 DIAGNOSIS — D8686 Sarcoid arthropathy: Secondary | ICD-10-CM | POA: Diagnosis not present

## 2022-01-14 DIAGNOSIS — D869 Sarcoidosis, unspecified: Secondary | ICD-10-CM | POA: Diagnosis not present

## 2022-01-14 DIAGNOSIS — D860 Sarcoidosis of lung: Secondary | ICD-10-CM | POA: Diagnosis not present

## 2022-01-14 DIAGNOSIS — D8689 Sarcoidosis of other sites: Secondary | ICD-10-CM | POA: Diagnosis not present

## 2022-01-17 DIAGNOSIS — Z0000 Encounter for general adult medical examination without abnormal findings: Secondary | ICD-10-CM | POA: Diagnosis not present

## 2022-01-17 DIAGNOSIS — Z6836 Body mass index (BMI) 36.0-36.9, adult: Secondary | ICD-10-CM | POA: Diagnosis not present

## 2022-01-17 DIAGNOSIS — F329 Major depressive disorder, single episode, unspecified: Secondary | ICD-10-CM | POA: Diagnosis not present

## 2022-01-17 DIAGNOSIS — R5383 Other fatigue: Secondary | ICD-10-CM | POA: Insufficient documentation

## 2022-01-17 DIAGNOSIS — D860 Sarcoidosis of lung: Secondary | ICD-10-CM | POA: Diagnosis not present

## 2022-01-17 DIAGNOSIS — E661 Drug-induced obesity: Secondary | ICD-10-CM | POA: Diagnosis not present

## 2022-01-17 DIAGNOSIS — I10 Essential (primary) hypertension: Secondary | ICD-10-CM | POA: Diagnosis not present

## 2022-01-17 DIAGNOSIS — G4733 Obstructive sleep apnea (adult) (pediatric): Secondary | ICD-10-CM | POA: Diagnosis not present

## 2022-01-17 DIAGNOSIS — Z13220 Encounter for screening for lipoid disorders: Secondary | ICD-10-CM | POA: Diagnosis not present

## 2022-01-17 DIAGNOSIS — R7303 Prediabetes: Secondary | ICD-10-CM | POA: Diagnosis not present

## 2022-01-17 DIAGNOSIS — E559 Vitamin D deficiency, unspecified: Secondary | ICD-10-CM | POA: Diagnosis not present

## 2022-01-17 DIAGNOSIS — R69 Illness, unspecified: Secondary | ICD-10-CM | POA: Diagnosis not present

## 2022-01-17 DIAGNOSIS — G43909 Migraine, unspecified, not intractable, without status migrainosus: Secondary | ICD-10-CM | POA: Diagnosis not present

## 2022-01-17 DIAGNOSIS — F419 Anxiety disorder, unspecified: Secondary | ICD-10-CM | POA: Diagnosis not present

## 2022-01-17 DIAGNOSIS — Z23 Encounter for immunization: Secondary | ICD-10-CM | POA: Diagnosis not present

## 2022-01-20 DIAGNOSIS — J0100 Acute maxillary sinusitis, unspecified: Secondary | ICD-10-CM | POA: Diagnosis not present

## 2022-01-20 DIAGNOSIS — Z6835 Body mass index (BMI) 35.0-35.9, adult: Secondary | ICD-10-CM | POA: Diagnosis not present

## 2022-02-11 ENCOUNTER — Encounter: Payer: Self-pay | Admitting: Occupational Therapy

## 2022-02-11 ENCOUNTER — Ambulatory Visit: Payer: 59 | Attending: Rheumatology | Admitting: Occupational Therapy

## 2022-02-11 DIAGNOSIS — M6281 Muscle weakness (generalized): Secondary | ICD-10-CM | POA: Insufficient documentation

## 2022-02-11 DIAGNOSIS — M25531 Pain in right wrist: Secondary | ICD-10-CM | POA: Diagnosis not present

## 2022-02-11 NOTE — Therapy (Signed)
[facility] [address], [place], [address] Phone: [phone]   Fax:  [phone]  Occupational Therapy Evaluation  Patient Details  Name: Samantha [name] MRN: [id number] Date of Birth: 1985/07/29 Referring Provider (OT): Defoor PA   Encounter Date: 02/11/2022   OT End of Session - 02/11/22 1019     Visit Number 1    Number of Visits 8    Date for OT Re-Evaluation 03/25/22    OT Start Time 0828    OT Stop Time 0911    OT Time Calculation (min) 43 min    Activity Tolerance Patient tolerated treatment well    Behavior During Therapy WFL for tasks assessed/performed             Past Medical History:  Diagnosis Date   Anxiety    Depression    Hypertension    Pre-diabetes     Past Surgical History:  Procedure Laterality Date   NO PAST SURGERIES     VIDEO BRONCHOSCOPY WITH ENDOBRONCHIAL ULTRASOUND N/A 09/28/2020   Procedure: VIDEO BRONCHOSCOPY WITH ENDOBRONCHIAL ULTRASOUND;  Surgeon: Ottie Glazier, MD;  Location: [facility] ORS;  Service: Thoracic;  Laterality: N/A;    There were no vitals filed for this visit.   Subjective Assessment - 02/11/22 1009     Subjective  I have carcoidosis - diagnosis more than year - tried several medications -wrist and hand bothers me the last 3-4 months after my last flare- wrist pain increase 6-9/10 with use if not using glove on my wrist.    Pertinent History PA Defoor note 01/14/22 - Vung Kush is a 37 y.o.female who presents today for follow up of sarcoidosis with pulmonary, neurologic, and musculoskeletal involvement . She was last seen 10/02/2021 and was doing poorly in her hands and wrists but better with her breathing, with increased stress. 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  She subsequently got an infliximab infusion reaction of myalgias and chest discomfort which improved  on prednisone. We subsequently decided future infusions will include premedication with solumedrol 40mg  IV.  Today She is doing well since last visit other than her infusion reaction. She notes her shortness of breath bothers her occasionally but not often, and her muscle cramps do still happen from time to time but she never took the potassium prescribed at the emergency department, and wanted to discuss it with [place] first. She is not having any new bothersome symptoms, and feels her energy and function are doing well overall. She notes her most bothersome ongoing symptom is pain in her right hand, particularly her wrist, which worsens with weight bearing and activity and improves with wearing an orthopedic glove.  She is taking remicade and imuran and has not experienced any other side effects to these medications. She denies any interval infections.  Samantha [name] has tried the following rheumatologic medications in the past: - Prednisone - helps her chest pains but she isn't sure about her joint pains, it has been a while since her last taper. Substantial appetite increase. - Imuran - current, helps, no adverse effects she knows of - Lyrica - was stopped due to suspicion regarding ankle swelling, it was helping an estimated 50%. 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 She is also doing online schooling and has a 17 year old son. She is a former smoker who smokes 0.5 ppd for 10 years, for a total of 5 pack years. She quit smoking around 2020-refer to OT    Patient Stated Goals Want my R wrist pain better so I can do my work as Associate Professor and  start working out and do things around the house    Currently in Pain? Yes    Pain Score 6     Pain Location Wrist    Pain Orientation Right   dorsal   Pain Descriptors / Indicators Aching;Dull;Tightness;Sore    Pain Type Chronic pain    Pain Onset More than a month ago    Pain Frequency Intermittent               [facility] OT Assessment - 02/11/22 0001       Assessment   Medical Diagnosis R wrist and hand pain -    Referring Provider (OT) Defoor PA    Onset Date/Surgical Date 10/11/21    Hand Dominance Right      Precautions   Required Braces or Orthoses --   glove with velcro strap     Home  Environment   Lives With Son      Prior Function   Vocation Full time employment    Leisure work as Associate Professor, start online school full time in week - 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      AROM   Right Wrist Extension 70 Degrees    Right Wrist Flexion 85 Degrees    Right Wrist Radial Deviation 30 Degrees    Right Wrist Ulnar Deviation 45 Degrees      Strength   Right Hand Grip (lbs) 39   pain dorsal wrist   Right Hand Lateral Pinch 15 lbs    Right Hand 3 Point Pinch 11 lbs   pain dorsal wrist   Left Hand Grip (lbs) 55    Left Hand Lateral Pinch 15 lbs    Left Hand 3 Point Pinch 14 lbs      Right Hand AROM   R Thumb Opposition to Index --   WNL to [facility]   R Index  MCP 0-90 80 Degrees    R Index PIP 0-100 95 Degrees    R Long  MCP 0-90 85 Degrees    R Long PIP 0-100 95 Degrees    R Ring  MCP 0-90 85 Degrees    R Ring PIP 0-100 95 Degrees    R Little  MCP 0-90 85 Degrees    R Little PIP 0-100 100 Degrees                      OT Treatments/Exercises (OP) - 02/11/22 0001       RUE Fluidotherapy   Number Minutes Fluidotherapy 8 Minutes    RUE Fluidotherapy Location Hand;Wrist    Comments AROM decrease pain with AROM            Decrease pain - review with pt to decrease pain using Benik neoprene most of time  HEP - heat 2-3 x day prior to  pain free AROM for wrist flexion, ext , RD, UD  10 reps  And joint protection and modifications review - hand out provided and reviewed          OT Education - 02/11/22 1018     Education Details findings of eval and HEP    Person(s) Educated Patient    Methods Explanation;Demonstration;Verbal cues;Handout;Tactile cues    Comprehension Verbalized understanding;Returned demonstration;Verbal cues required                 OT Long Term Goals - 02/11/22 1024       OT LONG TERM GOAL #1   Title Pt to be ind in HEP to decrease pain to less than 3/10 while maintaining her R wrist and digits AROM WNL .    Baseline pain 6-9/10 dorsal wrist - with motion and use    Time 2    Period Weeks    Status New    Target Date 02/25/22      OT LONG TERM GOAL #2   Title R wrist AROM improve to WNL and able to initiate strengthening symptoms free    Baseline pain 6-9/10 for wrist flexion and ext - AROM only - weight bearing and pulling - increase pain    Time 4    Period Weeks    Status New    Target Date 03/11/22      OT LONG TERM GOAL #3   Title R wrist strength and grip/prehension increase to WNL for her age and symptoms free to push, pull heavy door symptoms free    Baseline strength decrease for wrist ext , RD,UD - pain 6-9/10 in end range AROM - pain increase with grip and prehension, PROM and resistance    Time 6    Period Weeks    Status New    Target Date 03/25/22                   Plan - 02/11/22 1020     Clinical Impression Statement Pt present at OT eval with diagnosis of sarcoidosis with R wrist more than hand pain - pt report since last flare she had pain 6-9/10 in R dominant hand wrist for the last 3-4 months. Increase with use and ROM - mostly on dorsal wrist . Pt AROM for wrist WNL but pain in end range and tenderness over dorsal wrist. Decrease strength for wrist ext more than flexion with increase pain - decrease grip and prehension strength with increase  pain - limiting her functional use of R dominant hand in ADL's and IADL's - pt can benefit from skilled OT services    OT Occupational Profile and History Problem Focused Assessment - Including review of records relating to presenting problem    Occupational performance deficits (Please refer to evaluation for details): ADL's;IADL's;Work;Play;Leisure    Body Structure / Function / Physical Skills ADL;Flexibility;ROM;UE functional use;Decreased knowledge of use of DME;Pain;Strength;IADL    Rehab Potential Good    Clinical Decision Making Limited treatment options, no task modification necessary    Comorbidities Affecting Occupational Performance: None    Modification or Assistance to Complete Evaluation  No modification of tasks or assist necessary to complete eval    OT Frequency --   1-2 x wk depending on progress   OT Duration 6 weeks    OT Treatment/Interventions Self-care/ADL training;Ultrasound;Therapeutic exercise;Paraffin;Fluidtherapy;Contrast Bath;DME and/or AE instruction;Manual Therapy;Splinting;Patient/family education    Consulted and Agree with Plan of Care Patient             Patient will benefit from skilled therapeutic intervention  in order to improve the following deficits and impairments:   Body Structure / Function / Physical Skills: ADL, Flexibility, ROM, UE functional use, Decreased knowledge of use of DME, Pain, Strength, IADL       Visit Diagnosis: Pain in right wrist  Muscle weakness (generalized)    Problem List Patient Active Problem List   Diagnosis Date Noted   Chest mass 07/04/2020   Essential hypertension 12/30/2019   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 [place], [address] Phone: [phone]   Fax:  [phone]  Name: Samantha [name] MRN: [id number] Date of Birth: Nov 06, 1985

## 2022-02-12 DIAGNOSIS — G4733 Obstructive sleep apnea (adult) (pediatric): Secondary | ICD-10-CM | POA: Diagnosis not present

## 2022-02-13 DIAGNOSIS — Z3042 Encounter for surveillance of injectable contraceptive: Secondary | ICD-10-CM | POA: Diagnosis not present

## 2022-02-13 DIAGNOSIS — G4733 Obstructive sleep apnea (adult) (pediatric): Secondary | ICD-10-CM | POA: Diagnosis not present

## 2022-02-17 ENCOUNTER — Ambulatory Visit: Payer: 59 | Admitting: Occupational Therapy

## 2022-02-17 DIAGNOSIS — D860 Sarcoidosis of lung: Secondary | ICD-10-CM | POA: Diagnosis not present

## 2022-02-21 ENCOUNTER — Ambulatory Visit: Payer: 59 | Admitting: Occupational Therapy

## 2022-02-21 DIAGNOSIS — M25531 Pain in right wrist: Secondary | ICD-10-CM

## 2022-02-21 DIAGNOSIS — M6281 Muscle weakness (generalized): Secondary | ICD-10-CM | POA: Diagnosis not present

## 2022-02-21 NOTE — Therapy (Signed)
[name] [address] [place], [address] Phone: [phone]   Fax:  [phone]  Occupational Therapy Treatment  Patient Details  Name: Samantha [name] MRN: [id number] Date of Birth: Nov 13, 1985 Referring Provider (OT): Defoor PA   Encounter Date: 02/21/2022   OT End of Session - 02/21/22 0917     Visit Number 2    Number of Visits 8    Date for OT Re-Evaluation 03/25/22    OT Start Time 0830    OT Stop Time 0907    OT Time Calculation (min) 37 min    Activity Tolerance Patient tolerated treatment well    Behavior During Therapy WFL for tasks assessed/performed             Past Medical History:  Diagnosis Date   Anxiety    Depression    Hypertension    Pre-diabetes     Past Surgical History:  Procedure Laterality Date   NO PAST SURGERIES     VIDEO BRONCHOSCOPY WITH ENDOBRONCHIAL ULTRASOUND N/A 09/28/2020   Procedure: VIDEO BRONCHOSCOPY WITH ENDOBRONCHIAL ULTRASOUND;  Surgeon: Vida Rigger, MD;  Location: [facility] ORS;  Service: Thoracic;  Laterality: N/A;    There were no vitals filed for this visit.   Subjective Assessment - 02/21/22 0915     Subjective  My wrist hurts more at night time -also getting some numbness at night time from my elbow down to pinkie/ring finger - some pain during day -pain still increase to 6/10    Pertinent History PA Defoor note 01/14/22 - Samantha [name] is a 37 y.o.female who presents today for follow up of sarcoidosis with pulmonary, neurologic, and musculoskeletal involvement . She was last seen 10/02/2021 and was doing poorly in her hands and wrists but better with her breathing, with increased stress. 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  She subsequently got an infliximab infusion reaction of myalgias and chest discomfort which improved on prednisone. We subsequently decided  future infusions will include premedication with solumedrol 40mg  IV.  Today She is doing well since last visit other than her infusion reaction. She notes her shortness of breath bothers her occasionally but not often, and her muscle cramps do still happen from time to time but she never took the potassium prescribed at the emergency department, and wanted to discuss it with first. She is not having any new bothersome symptoms, and feels her energy and function are doing well overall. She notes her most bothersome ongoing symptom is pain in her right hand, particularly her wrist, which worsens with weight bearing and activity and improves with wearing an orthopedic glove.  She is taking remicade and imuran and has not experienced any other side effects to these medications. She denies any interval infections.  Samantha [name] has tried the following rheumatologic medications in the past: - Prednisone - helps her chest pains but she isn't sure about her joint pains, it has been a while since her last taper. Substantial appetite increase. - Imuran - current, helps, no adverse effects she knows of - Lyrica - was stopped due to suspicion regarding ankle swelling, it was helping an estimated 50%. 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 She is also doing online schooling and has a 35 year old son. She is a former smoker who smokes 0.5 ppd for 10 years, for a total of 5 pack years. She quit smoking around 2020-refer to OT    Patient Stated Goals Want my R wrist pain better so I can do my work as Associate Professor and start working out and do things around the  house    Currently in Pain? No/denies                Samantha [name] OT Assessment - 02/21/22 0001       AROM   Right Wrist Extension 70 Degrees    Right Wrist Flexion 85 Degrees    Right Wrist Radial Deviation 30 Degrees    Right Wrist Ulnar Deviation 45 Degrees      Strength   Right Hand Grip (lbs) 50   no pain   Right Hand Lateral Pinch 15 lbs    Right Hand 3 Point Pinch 13 lbs   little pain   Left Hand Grip (lbs) 55              Pain improved to 6/10 at the most the last week - night time worse per pt  And some numbness night time Negative for Tinel and tenderness over cubital tunnel  Pt to make sure positional at night time for arm        OT Treatments/Exercises (OP) - 02/21/22 0001       RUE Fluidotherapy   Number Minutes Fluidotherapy 8 Minutes    RUE Fluidotherapy Location Hand;Wrist    Comments AROM for wrist pain free prior to review of HEP             Pt pain free AROM in all planes   No pain with resistance for RD, UD , and sup /pro  No pain for flexion , ext - no pain resistance in neutral wrist  Soft tissue mobs to volar and dorsal forearm and wrist -MC spreads and webspace   Cont same HEP using contrast or heat prior to    pain free AROM for wrist flexion, ext , RD, UD  10 reps Fitted pt with Immobilization splint to wear night time and with activities that cause pain during day  Other wise can do Benik neoprene splint    And joint protection and modifications review - hand out provided and reviewed          OT Education - 02/21/22 0917     Education Details progress and changes to HEP    Person(s) Educated Patient    Methods Explanation;Demonstration;Verbal cues;Handout;Tactile cues    Comprehension Verbalized understanding;Returned demonstration;Verbal cues required                 OT Long Term Goals - 02/11/22 1024       OT LONG TERM GOAL #1   Title Pt to be ind in HEP to decrease pain to less than 3/10 while  maintaining her R wrist and digits AROM WNL .    Baseline pain 6-9/10 dorsal wrist - with motion and use    Time 2    Period Weeks    Status New    Target Date 02/25/22      OT LONG TERM GOAL #2   Title R  wrist AROM improve to WNL and able to initiate strengthening symptoms free    Baseline pain 6-9/10 for wrist flexion and ext - AROM only - weight bearing and pulling - increase pain    Time 4    Period Weeks    Status New    Target Date 03/11/22      OT LONG TERM GOAL #3   Title R wrist strength and grip/prehension increase to WNL for her age and symptoms free to push, pull heavy door symptoms free    Baseline strength decrease for wrist ext , RD,UD - pain 6-9/10 in end range AROM - pain increase with grip and prehension, PROM and resistance    Time 6    Period Weeks    Status New    Target Date 03/25/22                   Plan - 02/21/22 0917     Clinical Impression Statement Pt present at OT eval with diagnosis of sarcoidosis with R wrist more than hand pain - pt report since last flare she had pain 6-9/10 in R dominant hand wrist for the last 3-4 months.  Pt arrive this date with less pain at end range AROM for wrist flexion, ext - pain at the most this past week 6/10 and night time. Pt with increase grip and 3 point pinch with less pain. Pt do report some cubital tunnel symptoms at night time- ed on positional changes -and fitted with immobilization splint for night time and some during day as needed. Pt cont to be limited her functional use of R dominant hand in ADL's and IADL's - pt can benefit from skilled OT services    OT Occupational Profile and History Problem Focused Assessment - Including review of records relating to presenting problem    Occupational performance deficits (Please refer to evaluation for details): ADL's;IADL's;Work;Play;Leisure    Body Structure / Function / Physical Skills ADL;Flexibility;ROM;UE functional use;Decreased knowledge of use of  DME;Pain;Strength;IADL    Rehab Potential Good    Clinical Decision Making Limited treatment options, no task modification necessary    Comorbidities Affecting Occupational Performance: None    Modification or Assistance to Complete Evaluation  No modification of tasks or assist necessary to complete eval    OT Frequency --   1-2 x wk depending on progress   OT Duration 6 weeks    OT Treatment/Interventions Self-care/ADL training;Ultrasound;Therapeutic exercise;Paraffin;Fluidtherapy;Contrast Bath;DME and/or AE instruction;Manual Therapy;Splinting;Patient/family education    Consulted and Agree with Plan of Care Patient             Patient will benefit from skilled therapeutic intervention in order to improve the following deficits and impairments:   Body Structure / Function / Physical Skills: ADL, Flexibility, ROM, UE functional use, Decreased knowledge of use of DME, Pain, Strength, IADL       Visit Diagnosis: Pain in right wrist  Muscle weakness (generalized)    Problem List Patient Active Problem List   Diagnosis Date Noted   Chest mass 07/04/2020   Essential hypertension 12/30/2019   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 [place], [address] Phone: [phone]  Fax:  [phone]  Name: Samantha [name] MRN: [id number] Date of Birth: Feb 01, 1986

## 2022-02-28 ENCOUNTER — Ambulatory Visit: Payer: 59 | Admitting: Occupational Therapy

## 2022-02-28 DIAGNOSIS — M25531 Pain in right wrist: Secondary | ICD-10-CM

## 2022-02-28 DIAGNOSIS — M6281 Muscle weakness (generalized): Secondary | ICD-10-CM | POA: Diagnosis not present

## 2022-02-28 NOTE — Therapy (Signed)
[facility] [address], [place], [address] Phone: [phone]   Fax:  [phone]  Occupational Therapy Treatment  Patient Details  Name: TANIYA DASHER MRN: [id number] Date of Birth: 04-19-1985 Referring Provider (OT): Defoor PA   Encounter Date: 02/28/2022   OT End of Session - 02/28/22 0915     Visit Number 3    Number of Visits 8    Date for OT Re-Evaluation 03/25/22    OT Start Time 0838    OT Stop Time 0915    OT Time Calculation (min) 37 min    Activity Tolerance Patient tolerated treatment well    Behavior During Therapy [facility] for tasks assessed/performed             Past Medical History:  Diagnosis Date   Anxiety    Depression    Hypertension    Pre-diabetes     Past Surgical History:  Procedure Laterality Date   NO PAST SURGERIES     VIDEO BRONCHOSCOPY WITH ENDOBRONCHIAL ULTRASOUND N/A 09/28/2020   Procedure: VIDEO BRONCHOSCOPY WITH ENDOBRONCHIAL ULTRASOUND;  Surgeon: Vida Rigger, MD;  Location: [facility] ORS;  Service: Thoracic;  Laterality: N/A;    There were no vitals filed for this visit.   Subjective Assessment - 02/28/22 0914     Subjective  I am doing better-. Pain is better at the most 3/10 this past week. Wearing my brace less than 50% - night time and at work    Pertinent History PA Defoor note 01/14/22 - Nashae Maudlin is a 37 y.o.female who presents today for follow up of sarcoidosis with pulmonary, neurologic, and musculoskeletal involvement . She was last seen 10/02/2021 and was doing poorly in her hands and wrists but better with her breathing, with increased stress. 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  She subsequently got an infliximab infusion reaction of myalgias and chest discomfort which improved on prednisone. We subsequently decided future infusions will include premedication  with solumedrol 40mg  IV.  Today She is doing well since last visit other than her infusion reaction. She notes her shortness of breath bothers her occasionally but not often, and her muscle cramps do still happen from time to time but she never took the potassium prescribed at the emergency department, and wanted to discuss it with first. She is not having any new bothersome symptoms, and feels her energy and function are doing well overall. She notes her most bothersome ongoing symptom is pain in her right hand, particularly her wrist, which worsens with weight bearing and activity and improves with wearing an orthopedic glove.  She is taking remicade and imuran and has not experienced any other side effects to these medications. She denies any interval infections.  Talayeh Royle has tried the following rheumatologic medications in the past: - Prednisone - helps her chest pains but she isn't sure about her joint pains, it has been a while since her last taper. Substantial appetite increase. - Imuran - current, helps, no adverse effects she knows of - Lyrica - was stopped due to suspicion regarding ankle swelling, it was helping an estimated 50%. 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 She is also doing online schooling and has a 11 year old son. She is a former smoker who smokes 0.5 ppd for 10 years, for a total of 5 pack years. She quit smoking around 2020-refer to OT    Patient Stated Goals Want my R wrist pain better so I can do my work as Occupational psychologist and start working out and do things around the house    Currently in Pain? Yes    Pain  Score 1     Pain Location --   dorsal 4th digit with wrist ext   Pain Orientation Right    Pain Descriptors / Indicators Sore    Pain Type Acute pain                [facility] OT Assessment - 02/28/22 0001       AROM   Right Wrist Extension 73 Degrees    Right Wrist Flexion 90 Degrees    Right Wrist Radial Deviation 30 Degrees    Right Wrist Ulnar Deviation 45 Degrees      Strength   Right Hand Grip (lbs) 55    Right Hand Lateral Pinch 17 lbs    Right Hand 3 Point Pinch 15 lbs    Left Hand Grip (lbs) 55    Left Hand Lateral Pinch 15 lbs    Left Hand 3 Point Pinch 14 lbs            Pt showed great progress in pain, AROM and grip /prehension strength  See flowsheet  No issues with cubital tunnel  Do have 1/10 tenderness over 1st dorsal compartment and FInkelstein Webspace still tight and tender but improving No pain at wrist with any palpation or joint mobs this date            OT Treatments/Exercises (OP) - 02/28/22 0001       RUE Fluidotherapy   Number Minutes Fluidotherapy 6 Minutes    RUE Fluidotherapy Location Hand;Wrist    Comments AROM prior to isometric strengtening                        Pt had pain with attempted to ext wrist with extender digits - strain over 4th dorsal MC  But after fluido no pain  Reminded pt to use wrist muscles for wrist flexion , ext - no long extensors  No pain with resistance for RD, UD , and sup /pro  No pain for flexion , ext - no pain resistance in neutral wrist  Soft tissue mobs to volar and dorsal forearm and wrist -MC spreads and webspace    Add this date to pt isometric for wrist flexion, ext and RD, UD in neutral -12 reps  Can increase to 2nd set in 3 days if symptoms free And 6 days 3rd set - 2 x day  Cont to wear  Immobilization splint to wear night time and with activities that cause pain during day  Other wise can do Benik neoprene splint or nothing    And joint protection and modifications  review - hand out provided and reviewed               OT Education - 02/28/22 0915     Education Details progress and changes to HEP    Person(s) Educated Patient    Methods  Explanation;Demonstration;Verbal cues;Handout;Tactile cues    Comprehension Verbalized understanding;Returned demonstration;Verbal cues required                 OT Long Term Goals - 02/11/22 1024       OT LONG TERM GOAL #1   Title Pt to be ind in HEP to decrease pain to less than 3/10 while maintaining her R wrist and digits AROM WNL .    Baseline pain 6-9/10 dorsal wrist - with motion and use    Time 2    Period Weeks    Status New    Target Date 02/25/22      OT LONG TERM GOAL #2   Title R wrist AROM improve to WNL and able to initiate strengthening symptoms free    Baseline pain 6-9/10 for wrist flexion and ext - AROM only - weight bearing and pulling - increase pain    Time 4    Period Weeks    Status New    Target Date 03/11/22      OT LONG TERM GOAL #3   Title R wrist strength and grip/prehension increase to WNL for her age and symptoms free to push, pull heavy door symptoms free    Baseline strength decrease for wrist ext , RD,UD - pain 6-9/10 in end range AROM - pain increase with grip and prehension, PROM and resistance    Time 6    Period Weeks    Status New    Target Date 03/25/22                   Plan - 02/28/22 0916     OT Occupational Profile and History Problem Focused Assessment - Including review of records relating to presenting problem    Occupational performance deficits (Please refer to evaluation for details): ADL's;IADL's;Work;Play;Leisure    Body Structure / Function / Physical Skills ADL;Flexibility;ROM;UE functional use;Decreased knowledge of use of DME;Pain;Strength;IADL    Rehab Potential Good    Clinical Decision Making Limited treatment options, no task modification necessary    Comorbidities Affecting Occupational Performance: None     Modification or Assistance to Complete Evaluation  No modification of tasks or assist necessary to complete eval    OT Frequency Biweekly    OT Duration 4 weeks    OT Treatment/Interventions Self-care/ADL training;Ultrasound;Therapeutic exercise;Paraffin;Fluidtherapy;Contrast Bath;DME and/or AE instruction;Manual Therapy;Splinting;Patient/family education    Consulted and Agree with Plan of Care Patient             Patient will benefit from skilled therapeutic intervention in order to improve the following deficits and impairments:   Body Structure / Function / Physical Skills: ADL, Flexibility, ROM, UE functional use, Decreased knowledge of use of DME, Pain, Strength, IADL       Visit Diagnosis: Pain in right wrist  Muscle weakness (generalized)    Problem List Patient Active Problem List   Diagnosis Date Noted   Chest mass 07/04/2020   Essential hypertension 12/30/2019   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, [place], [address] Phone: [phone]   Fax:  [phone]  Name: DAVENA JULIAN MRN: [id number] Date of Birth: [birth year]

## 2022-03-05 DIAGNOSIS — R0981 Nasal congestion: Secondary | ICD-10-CM | POA: Diagnosis not present

## 2022-03-05 DIAGNOSIS — R040 Epistaxis: Secondary | ICD-10-CM | POA: Diagnosis not present

## 2022-03-05 DIAGNOSIS — D860 Sarcoidosis of lung: Secondary | ICD-10-CM | POA: Diagnosis not present

## 2022-03-05 DIAGNOSIS — R079 Chest pain, unspecified: Secondary | ICD-10-CM | POA: Diagnosis not present

## 2022-03-05 DIAGNOSIS — Z03818 Encounter for observation for suspected exposure to other biological agents ruled out: Secondary | ICD-10-CM | POA: Diagnosis not present

## 2022-03-13 ENCOUNTER — Ambulatory Visit: Payer: 59 | Attending: Rheumatology | Admitting: Occupational Therapy

## 2022-03-14 DIAGNOSIS — G4733 Obstructive sleep apnea (adult) (pediatric): Secondary | ICD-10-CM | POA: Diagnosis not present

## 2022-03-15 DIAGNOSIS — G4733 Obstructive sleep apnea (adult) (pediatric): Secondary | ICD-10-CM | POA: Diagnosis not present

## 2022-03-18 ENCOUNTER — Encounter: Payer: Self-pay | Admitting: Internal Medicine

## 2022-03-18 ENCOUNTER — Ambulatory Visit: Payer: 59 | Admitting: Internal Medicine

## 2022-03-18 ENCOUNTER — Telehealth: Payer: Self-pay | Admitting: Internal Medicine

## 2022-03-18 VITALS — BP 138/101 | HR 125 | Temp 98.4°F | Resp 16 | Ht 65.0 in | Wt 212.0 lb

## 2022-03-18 DIAGNOSIS — D862 Sarcoidosis of lung with sarcoidosis of lymph nodes: Secondary | ICD-10-CM

## 2022-03-18 DIAGNOSIS — R053 Chronic cough: Secondary | ICD-10-CM | POA: Diagnosis not present

## 2022-03-18 NOTE — Telephone Encounter (Signed)
Lvm notifying patient that 04/01/22 appointment has been moved to 03/31/22-Samantha [name]

## 2022-03-18 NOTE — Patient Instructions (Signed)
Sarcoidosis  Sarcoidosis is a disease that can cause inflammation in many areas of the body. It most often affects the lungs (pulmonary sarcoidosis). Sarcoidosis can also affect the lymph nodes, liver, eyes, skin, heart, or any other body tissue. Normally, cells that are part of the body's disease-fighting system (immune system) attack harmful substances in the body, such as germs. This immune system response causes inflammation. After the harmful substance is destroyed, the inflammation goes away. When you have sarcoidosis, your immune system causes inflammation even when there are no harmful substances, and the inflammation does not go away. Sarcoidosis also causes cells from your immune system to form small lumps (granulomas) in the affected area of your body. What are the causes? The exact cause of sarcoidosis is not known.  If you have a family history of this disease (genetic predisposition), the immune system response that leads to inflammation may be triggered by something in your environment, such as: Bacteria or viruses. Metals. Chemicals. Dust. Mold or mildew. What increases the risk? You may be more likely to develop this condition if you: Have a family history of the disease. Are African American. Are of Northern European descent. Are 20-50 years old. Are female. Work as a firefighter. Work in an environment where you are exposed to metals, chemicals, mold or mildew, or insecticides. What are the signs or symptoms? Some people with sarcoidosis have no symptoms. Others have very mild symptoms. The symptoms usually depend on the organ that is affected. Sarcoidosis most often affects the lungs, which may lead to symptoms such as: Chest pain. Coughing. Wheezing. Shortness of breath. Other common symptoms include: Night sweats. Fever. Weight loss. Tiredness (fatigue). Swollen lymph nodes. Joint pain. How is this diagnosed? This condition may be diagnosed based on: Your  symptoms and medical history. A physical exam. Imaging tests such as: Chest X-ray. CT scan. MRI. PET scan. Lung function tests. These tests evaluate your breathing and check for problems that may be related to sarcoidosis. A procedure to remove a tissue sample for testing (biopsy). You may have a biopsy of lung tissue if that is where you are having symptoms. You may have tests to check for any complications of the condition. These tests may include: Eye exams. MRI of the heart or brain. Echocardiogram. ECG (electrocardiogram). How is this treated? In some cases, sarcoidosis does not require a specific treatment because it causes no symptoms or only mild symptoms. If your symptoms bother you or are severe, you may be prescribed medicines to reduce inflammation or relieve symptoms. These medicines may include: Prednisone. This is a steroid that reduces inflammation related to sarcoidosis. Hydroxychloroquine. This may be used to treat sarcoidosis that affects the skin, eyes, or brain. Certain medicines that affect the immune system. These can help with sarcoidosis in the joints, eyes, skin, or lungs. Medicines that you breathe in (inhalers). Inhalers can help you breathe if sarcoidosis affects your lungs. Follow these instructions at home:  Do not use any products that contain nicotine or tobacco. These products include cigarettes, chewing tobacco, and vaping devices, such as e-cigarettes. If you need help quitting, ask your health care provider. Avoid secondhand smoke and irritating dust or chemicals. Stay indoors on days when air quality is poor in your area. Return to your normal activities as told by your health care provider. Ask your health care provider what activities are safe for you. Take or use over-the-counter and prescription medicines only as told by your health care provider. Keep all follow-up visits.   This is important. Where to find more information National Heart, Lung,  and Blood Institute: www.nhlbi.nih.gov Contact a health care provider if: You have vision problems. You have a dry cough that does not go away. You have an irregular heartbeat. You have pain or aches in your joints, hands, or feet. You have an unexplained rash. Get help right away if: You have chest pain. You have trouble breathing. These symptoms may represent a serious problem that is an emergency. Do not wait to see if the symptoms will go away. Get medical help right away. Call your local emergency services (911 in the [place] Do not drive yourself to the hospital. Summary Sarcoidosis is a disease that can cause inflammation in many body areas of the body. It most often affects the lungs (pulmonary sarcoidosis). It can also affect the lymph nodes, liver, eyes, skin, heart, or any other body tissue. When you have sarcoidosis, cells from your immune system form small lumps (granulomas) in the affected area of your body. Sarcoidosis sometimes does not require a specific treatment because it causes no symptoms or only mild symptoms. If your symptoms bother you or are severe, you may be prescribed medicines to reduce inflammation or relieve symptoms. This information is not intended to replace advice given to you by your health care provider. Make sure you discuss any questions you have with your health care provider. Document Revised: 11/29/2019 Document Reviewed: 11/29/2019 Elsevier Patient Education  2023 [employer]  

## 2022-03-18 NOTE — Progress Notes (Signed)
[facility] [place], [place] [address]  Pulmonary Sleep Medicine   Office Visit Note  Patient Name: Samantha [name] DOB: [birth year] MRN [id number]  Date of Service: 03/18/2022  Complaints/HPI: Patient states she has sarcoidosis. Apparently she was diagnosed back in 2022. She was having lymphadenopathy. Biopsy was done of lymph nodes that confirmed sarcoidosis. She states she been on imuran and also on RA tretment. She states she has been doing OK until she had a cold 2 weeks ago. She states she has been feeling like she did when she was first diagnosed. She has noted some blood in her nose . She was given prednisone but feels it has not helped. PFT has been normal based on the notes. At this time she feels SOB and feels like she has a cold. She was tested for RSV Flu and Covid which were all negative. She is feeling headaches also now. She is currently off prednisone. She had recently taken a taper of prednisone. She is on ventolin and symbicort has just been started  ROS  General: (-) fever, (-) chills, (-) night sweats, (-) weakness Skin: (-) rashes, (-) itching,. Eyes: (-) visual changes, (-) redness, (-) itching. Nose and Sinuses: (-) nasal stuffiness or itchiness, (-) postnasal drip, (-) nosebleeds, (-) sinus trouble. Mouth and Throat: (-) sore throat, (-) hoarseness. Neck: (-) swollen glands, (-) enlarged thyroid, (-) neck pain. Respiratory: - cough, (-) bloody sputum, + shortness of breath, - wheezing. Cardiovascular: - ankle swelling, (-) chest pain. Lymphatic: (-) lymph node enlargement. Neurologic: (-) numbness, (-) tingling. Psychiatric: (-) anxiety, (-) depression   Current Medication: Outpatient Encounter Medications as of 03/18/2022  Medication Sig   albuterol (VENTOLIN HFA) 108 (90 Base) MCG/ACT inhaler Inhale 2 puffs into the lungs every 6 (six) hours as needed for wheezing or shortness of breath.   amLODipine (NORVASC) 5 MG tablet Take 5 mg  by mouth daily.   buPROPion (WELLBUTRIN XL) 150 MG 24 hr tablet Take 150 mg by mouth daily.   cetirizine (ZYRTEC) 10 MG tablet Take 10 mg by mouth daily as needed for allergies.   fluticasone (FLONASE) 50 MCG/ACT nasal spray Place 1 spray into both nostrils daily as needed for allergies.   hydrochlorothiazide (HYDRODIURIL) 12.5 MG tablet Take 12.5 mg by mouth daily.   hydrOXYzine (ATARAX/VISTARIL) 25 MG tablet Take 25 mg by mouth at bedtime.   potassium chloride SA (KLOR-CON M) 20 MEQ tablet Take 1 tablet (20 mEq total) by mouth 3 (three) times daily.   traZODone (DESYREL) 100 MG tablet Take 100 mg by mouth at bedtime.   venlafaxine XR (EFFEXOR-XR) 150 MG 24 hr capsule Take 150 mg by mouth daily with breakfast.   MedroxyPROGESTERone Acetate 150 MG/ML SUSY Inject 1 mL (150 mg total) as directed once. (Patient taking differently: Inject 150 mg as directed every 3 (three) months.)   No facility-administered encounter medications on file as of 03/18/2022.    Surgical History: Past Surgical History:  Procedure Laterality Date   NO PAST SURGERIES     VIDEO BRONCHOSCOPY WITH ENDOBRONCHIAL ULTRASOUND N/A 09/28/2020   Procedure: VIDEO BRONCHOSCOPY WITH ENDOBRONCHIAL ULTRASOUND;  Surgeon: Ottie Glazier, MD;  Location: [facility] ORS;  Service: Thoracic;  Laterality: N/A;    Medical History: Past Medical History:  Diagnosis Date   Anxiety    Depression    Hypertension    Pre-diabetes     Family History: Family History  Problem Relation Age of Onset   Healthy Mother  Social History: Social History   Socioeconomic History   Marital status: Single    Spouse name: Not on file   Number of children: Not on file   Years of education: Not on file   Highest education level: Not on file  Occupational History   Not on file  Tobacco Use   Smoking status: Former    Packs/day: 0.50    Types: Cigarettes    Quit date: 07/2018    Years since quitting: 3.6   Smokeless tobacco: Never  Vaping Use    Vaping Use: Never used  Substance and Sexual Activity   Alcohol use: Yes    Comment: socially   Drug use: No   Sexual activity: Yes  Other Topics Concern   Not on file  Social History Narrative   Lives with son 8 y /o   Social Determinants of Health   Financial Resource Strain: Not on file  Food Insecurity: Not on file  Transportation Needs: Not on file  Physical Activity: Not on file  Stress: Not on file  Social Connections: Not on file  Intimate Partner Violence: Not on file    Vital Signs: Blood pressure (!) 138/101, pulse (!) 125, temperature 98.4 F (36.9 C), resp. rate 16, height 5' 5"$  (1.651 m), weight 212 lb (96.2 kg), SpO2 98 %.  Examination: General Appearance: The patient is well-developed, well-nourished, and in no distress. Skin: Gross inspection of skin unremarkable. Head: normocephalic, no gross deformities. Eyes: no gross deformities noted. ENT: ears appear grossly normal no exudates. Neck: Supple. No thyromegaly. No LAD. Respiratory: no rhonchi noted. Cardiovascular: Normal S1 and S2 without murmur or rub. Extremities: No cyanosis. pulses are equal. Neurologic: Alert and oriented. No involuntary movements.  LABS: Recent Results (from the past 2160 hour(s))  Basic metabolic panel     Status: Abnormal   Collection Time: 12/26/21  5:02 PM  Result Value Ref Range   Sodium 139 135 - 145 mmol/L   Potassium 2.8 (L) 3.5 - 5.1 mmol/L   Chloride 105 98 - 111 mmol/L   CO2 25 22 - 32 mmol/L   Glucose, Bld 97 70 - 99 mg/dL    Comment: Glucose reference range applies only to samples taken after fasting for at least 8 hours.   BUN 10 6 - 20 mg/dL   Creatinine, Ser 0.85 0.44 - 1.00 mg/dL   Calcium 9.3 8.9 - 10.3 mg/dL   GFR, Estimated >60 >60 mL/min    Comment: (NOTE) Calculated using the CKD-EPI Creatinine Equation (2021)    Anion gap 9 5 - 15    Comment: Performed at [facility], [address] [place], [place] [address]  CBC     Status:  None   Collection Time: 12/26/21  5:02 PM  Result Value Ref Range   WBC 6.8 4.0 - 10.5 K/uL   RBC 4.31 3.87 - 5.11 MIL/uL   Hemoglobin 13.1 12.0 - 15.0 g/dL   HCT 38.5 36.0 - 46.0 %   MCV 89.3 80.0 - 100.0 fL   MCH 30.4 26.0 - 34.0 pg   MCHC 34.0 30.0 - 36.0 g/dL   RDW 13.2 11.5 - 15.5 %   Platelets 324 150 - 400 K/uL   nRBC 0.0 0.0 - 0.2 %    Comment: Performed at [facility], [address] [place], [place] [address]  Troponin I (High Sensitivity)     Status: None   Collection Time: 12/26/21  5:02 PM  Result Value Ref Range  Troponin I (High Sensitivity) 3 <18 ng/L    Comment: (NOTE) Elevated high sensitivity troponin I (hsTnI) values and significant  changes across serial measurements may suggest ACS but many other  chronic and acute conditions are known to elevate hsTnI results.  Refer to the "Links" section for chest pain algorithms and additional  guidance. Performed at [facility], [address] [place], [place] [address]   Resp Panel by RT-PCR (Flu A&B, Covid) Anterior Nasal Swab     Status: None   Collection Time: 12/26/21  7:35 PM   Specimen: Anterior Nasal Swab  Result Value Ref Range   SARS Coronavirus 2 by RT PCR NEGATIVE NEGATIVE    Comment: (NOTE) SARS-CoV-2 target nucleic acids are NOT DETECTED.  The SARS-CoV-2 RNA is generally detectable in upper respiratory specimens during the acute phase of infection. The lowest concentration of SARS-CoV-2 viral copies this assay can detect is 138 copies/mL. A negative result does not preclude SARS-Cov-2 infection and should not be used as the sole basis for treatment or other patient management decisions. A negative result may occur with  improper specimen collection/handling, submission of specimen other than nasopharyngeal swab, presence of viral mutation(s) within the areas targeted by this assay, and inadequate number of viral copies(<138 copies/mL). A negative result must be combined  with clinical observations, patient history, and epidemiological information. The expected result is Negative.  Fact Sheet for Patients:  EntrepreneurPulse.com.au  Fact Sheet for Healthcare Providers:  IncredibleEmployment.be  This test is no t yet approved or cleared by the [place] FDA and  has been authorized for detection and/or diagnosis of SARS-CoV-2 by FDA under an Emergency Use Authorization (EUA). This EUA will remain  in effect (meaning this test can be used) for the duration of the COVID-19 declaration under Section 564(b)(1) of the Act, 21 U.S.C.section 360bbb-3(b)(1), unless the authorization is terminated  or revoked sooner.       Influenza A by PCR NEGATIVE NEGATIVE   Influenza B by PCR NEGATIVE NEGATIVE    Comment: (NOTE) The Xpert Xpress SARS-CoV-2/FLU/RSV plus assay is intended as an aid in the diagnosis of influenza from Nasopharyngeal swab specimens and should not be used as a sole basis for treatment. Nasal washings and aspirates are unacceptable for Xpert Xpress SARS-CoV-2/FLU/RSV testing.  Fact Sheet for Patients: EntrepreneurPulse.com.au  Fact Sheet for Healthcare Providers: IncredibleEmployment.be  This test is not yet approved or cleared by the [place] FDA and has been authorized for detection and/or diagnosis of SARS-CoV-2 by FDA under an Emergency Use Authorization (EUA). This EUA will remain in effect (meaning this test can be used) for the duration of the COVID-19 declaration under Section 564(b)(1) of the Act, 21 U.S.C. section 360bbb-3(b)(1), unless the authorization is terminated or revoked.  Performed at [facility], [address], [place], [place] [address]     Radiology: DG Chest 2 View  Result Date: 12/26/2021 CLINICAL DATA:  Chest pain. EXAM: CHEST - 2 VIEW COMPARISON:  None Available. FINDINGS: The heart size and mediastinal contours  are within normal limits. Both lungs are clear. The visualized skeletal structures are unremarkable. IMPRESSION: No active cardiopulmonary disease. Electronically Signed   By: Keane Police D.O.   On: 12/26/2021 17:23    No results found.  No results found.    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  For prognostic purposes advised suggested getting a CT scan of the chest.  Of course this will be an high-resolution cuts we will go ahead and get this ordered for her. - CT Chest High Resolution; Future  2. Obesity, morbid ([place]) Obesity Counseling: Had a lengthy discussion regarding patients BMI and weight issues. Patient was instructed on portion control as well as increased activity. Also discussed caloric restrictions with trying to maintain intake less than 2000 Kcal. Discussions were made in accordance with the 5As of weight management. Simple actions such as not eating late and if able to, taking a walk is suggested.   3. Chronic cough Probably related to the sarcoidosis of course cannot rule out possibility of GERD we might need to investigate that further depending on what the CT of the chest shows  General Counseling: I have discussed the findings of the evaluation and examination with [place].  I have also discussed any further diagnostic evaluation thatmay be needed or ordered today. Amorina verbalizes understanding of the findings of todays visit. We also reviewed her medications today and discussed drug interactions and side effects including but not limited excessive  drowsiness and altered mental states. We also discussed that there is always a risk not just to her but also people around her. she has been encouraged to call the office with any questions or concerns that should arise related to todays visit.  No orders of the defined types were placed in this encounter.    Time spent: 11  I have personally obtained a history, examined the patient, evaluated laboratory and imaging results, formulated the assessment and plan and placed orders.    Allyne Gee, MD [facility] Pulmonary and Critical Care Sleep medicine

## 2022-03-19 ENCOUNTER — Telehealth: Payer: Self-pay | Admitting: Internal Medicine

## 2022-03-19 DIAGNOSIS — R202 Paresthesia of skin: Secondary | ICD-10-CM | POA: Diagnosis not present

## 2022-03-19 DIAGNOSIS — R200 Anesthesia of skin: Secondary | ICD-10-CM | POA: Diagnosis not present

## 2022-03-19 DIAGNOSIS — M542 Cervicalgia: Secondary | ICD-10-CM | POA: Diagnosis not present

## 2022-03-19 DIAGNOSIS — M62838 Other muscle spasm: Secondary | ICD-10-CM | POA: Diagnosis not present

## 2022-03-19 NOTE — Telephone Encounter (Signed)
Lvm requesting call back regarding CT and follow up appointment-Toni

## 2022-03-27 ENCOUNTER — Ambulatory Visit: Payer: 59

## 2022-03-31 ENCOUNTER — Ambulatory Visit: Payer: 59 | Admitting: Internal Medicine

## 2022-04-01 ENCOUNTER — Ambulatory Visit: Payer: 59 | Admitting: Internal Medicine

## 2022-04-05 ENCOUNTER — Emergency Department
Admission: EM | Admit: 2022-04-05 | Discharge: 2022-04-05 | Disposition: A | Discharge status: Home / Self Care | Payer: 59 | Attending: Emergency Medicine | Admitting: Emergency Medicine

## 2022-04-05 ENCOUNTER — Other Ambulatory Visit: Payer: Self-pay

## 2022-04-05 ENCOUNTER — Emergency Department: Payer: 59

## 2022-04-05 ENCOUNTER — Ambulatory Visit: Admit: 2022-04-05 | Discharge status: Against Medical Advice | Payer: 59

## 2022-04-05 DIAGNOSIS — R079 Chest pain, unspecified: Secondary | ICD-10-CM

## 2022-04-05 DIAGNOSIS — R0789 Other chest pain: Secondary | ICD-10-CM | POA: Diagnosis not present

## 2022-04-05 LAB — CBC
HCT: 42.7 % (ref 36.0–46.0)
Hemoglobin: 14.4 g/dL (ref 12.0–15.0)
MCH: 30.7 pg (ref 26.0–34.0)
MCHC: 33.7 g/dL (ref 30.0–36.0)
MCV: 91 fL (ref 80.0–100.0)
Platelets: 414 10*3/uL — ABNORMAL HIGH (ref 150–400)
RBC: 4.69 MIL/uL (ref 3.87–5.11)
RDW: 12.7 % (ref 11.5–15.5)
WBC: 6.8 10*3/uL (ref 4.0–10.5)
nRBC: 0 % (ref 0.0–0.2)

## 2022-04-05 LAB — BASIC METABOLIC PANEL
Anion gap: 9 (ref 5–15)
BUN: 12 mg/dL (ref 6–20)
CO2: 25 mmol/L (ref 22–32)
Calcium: 9.4 mg/dL (ref 8.9–10.3)
Chloride: 104 mmol/L (ref 98–111)
Creatinine, Ser: 0.95 mg/dL (ref 0.44–1.00)
GFR, Estimated: >60 mL/min (ref >60)
Glucose, Bld: 75 mg/dL (ref 70–99)
Potassium: 3.3 mmol/L — ABNORMAL LOW (ref 3.5–5.1)
Sodium: 138 mmol/L (ref 135–145)

## 2022-04-05 LAB — D-DIMER, QUANTITATIVE: D-Dimer, Quant: 0.31 ug/mL-FEU (ref 0.00–0.50)

## 2022-04-05 LAB — TROPONIN I (HIGH SENSITIVITY): Troponin I (High Sensitivity): <2 ng/L (ref <18)

## 2022-04-05 LAB — POC URINE PREG, ED: Preg Test, Ur: NEGATIVE

## 2022-04-05 MED ORDER — KETOROLAC TROMETHAMINE 15 MG/ML IJ SOLN
15.0000 mg | Freq: Once | INTRAMUSCULAR | Status: AC
  Administered 2022-04-05: 15 mg via INTRAMUSCULAR
  Filled 2022-04-05: qty 1

## 2022-04-05 NOTE — ED Triage Notes (Signed)
Pt to ED from work for CP and Migraine x 2 weeks. Pt has HX of pulmonary sarcoidosis. Pt thinks it might be a flare up but is unsure. Pt states they normally give her prednisone to treat it. Pt was seen about 1 month ago for same. Pt is CAOx4 and in no acute distress and ambulatory in triage. Pt denies N/V/D/Fever.

## 2022-04-05 NOTE — Discharge Instructions (Addendum)
You are seen in the emergency department for chest pain.  Your heart enzyme was normal, do not believe you are having a heart attack.  Your screening test was negative for any blood clots.  Your chest x-ray did not show any signs of a pneumonia.  You could have pleurisy or costochondritis.  Alternate ibuprofen and Tylenol for pain control.  Follow-up closely with your primary care physician and your pulmonologist.  Return to the emergency department for worsening symptoms.  Pain control:  Ibuprofen (motrin/aleve/advil) - You can take 3-4 tablets (600-800 mg) every 6 hours as needed for pain/fever.  Acetaminophen (tylenol) - You can take 2 extra strength tablets (1000 mg) every 6 hours as needed for pain/fever.  You can alternate these medications or take them together.  Make sure you eat food/drink water when taking these medications.

## 2022-04-05 NOTE — ED Provider Notes (Signed)
[facility] Provider Note    Event Date/Time   First MD Initiated Contact with Patient 04/05/22 1517     (approximate)   History   Chest Pain and Migraine (X2 weeks)   HPI  Samantha [name] is a 37 y.o. female past medical history significant for sarcoidosis, who presents to the emergency department with chest pain.  Right-sided chest pain that she describes as a sharp stabbing pain.  Nothing improves or worsens.  Patient has been on prednisone.  Followed up with pulmonology approximately 1 month ago and had a chest x-ray done.  Denies any nausea vomiting or diarrhea.  Takes a Depo shot.  No prior history of DVT or PE.  No recent travel.  Denies any abdominal pain or nausea or vomiting.  Denies any cough or shortness of breath.  Denies any tobacco use or vaping.     Physical Exam   Triage Vital Signs: ED Triage Vitals  Enc Vitals Group     BP 04/05/22 1450 (!) 134/99     Pulse Rate 04/05/22 1450 100     Resp 04/05/22 1450 16     Temp 04/05/22 1450 98.7 F (37.1 C)     Temp Source 04/05/22 1450 Oral     SpO2 04/05/22 1450 98 %     Weight 04/05/22 1451 211 lb 13.8 oz (96.1 kg)     Height 04/05/22 1451 '5\' 5"'$  (1.651 m)     Head Circumference --      Peak Flow --      Pain Score 04/05/22 1451 10     Pain Loc --      Pain Edu? --      Excl. in [place]? --     Most recent vital signs: Vitals:   04/05/22 1450  BP: (!) 134/99  Pulse: 100  Resp: 16  Temp: 98.7 F (37.1 C)  SpO2: 98%    Physical Exam Constitutional:      Appearance: She is well-developed.  HENT:     Head: Atraumatic.  Eyes:     Conjunctiva/sclera: Conjunctivae normal.  Cardiovascular:     Rate and Rhythm: Regular rhythm.     Heart sounds: Normal heart sounds.  Pulmonary:     Effort: No respiratory distress.     Breath sounds: Normal breath sounds.  Abdominal:     General: There is no distension.     Palpations: Abdomen is soft.  Musculoskeletal:        General: Normal  range of motion.     Cervical back: Normal range of motion.     Right lower leg: No edema.     Left lower leg: No edema.  Skin:    General: Skin is warm.  Neurological:     Mental Status: She is alert. Mental status is at baseline.     IMPRESSION / MDM / ASSESSMENT AND PLAN / ED COURSE  I reviewed the triage vital signs and the nursing notes.  Differential diagnosis including sarcoidosis, pulmonary embolism, ACS, anemia, pneumonia  EKG  I, Nathaniel Man, the attending physician, personally viewed and interpreted this ECG.   Rate: 101  Rhythm: sinus tachycardia  Axis: Normal  Intervals: Normal  ST&T Change: None  No tachycardic or bradycardic dysrhythmias while on cardiac telemetry.  RADIOLOGY I independently reviewed imaging, my interpretation of imaging: Chest x-ray without findings of pneumonia.  Read as no acute findings.  LABS (all labs ordered are listed, but only abnormal results are displayed)  Labs interpreted as -  Low risk heart score.  Initial troponin negative, given that symptoms have been ongoing for the past multiple weeks do not feel that repeat troponin is necessary at this time.  Will add on a D-dimer, low risk Wells criteria but tachycardic on arrival.  Potassium mildly low at 3.3.  Patient already takes potassium replacement.  Discussed a diet of potassium and increasing her p.o. potassium as an outpatient to 3 times a week instead of 2 times a week.  No significant anemia.  Labs Reviewed  BASIC METABOLIC PANEL - Abnormal; Notable for the following components:      Result Value   Potassium 3.3 (*)    All other components within normal limits  CBC - Abnormal; Notable for the following components:   Platelets 414 (*)    All other components within normal limits  D-DIMER, QUANTITATIVE  POC URINE PREG, ED  TROPONIN I (HIGH SENSITIVITY)    TREATMENT  IM ketorolac  MDM    Low risk heart score, troponin is negative, chest pain is ongoing for more  than 6 hours, serial troponins unnecessary at this time, low suspicion for ACS.  No signs of pneumonia.  Low risk Wells criteria and D-dimer is negative have a low suspicion for pulmonary embolism.  Possible costochondritis or pleurisy.  Discussed close follow-up with primary care physician.  Discussed follow-up with her pulmonologist.  Given return precautions for any worsening symptoms.  Discussed NSAIDs and acetaminophen for pain control.  Clinical picture is not consistent with acute pericarditis.  Clinical concern for dissection.   PROCEDURES:  Critical Care performed: No  Procedures  Patient's presentation is most consistent with acute presentation with potential threat to life or bodily function.   MEDICATIONS ORDERED IN ED: Medications  ketorolac (TORADOL) 15 MG/ML injection 15 mg (has no administration in time range)    FINAL CLINICAL IMPRESSION(S) / ED DIAGNOSES   Final diagnoses:  Chest pain, unspecified type     Rx / DC Orders   ED Discharge Orders     None        Note:  This document was prepared using Dragon voice recognition software and may include unintentional dictation errors.   Nathaniel Man, MD 04/05/22 [phone]

## 2022-04-09 DIAGNOSIS — J329 Chronic sinusitis, unspecified: Secondary | ICD-10-CM | POA: Diagnosis not present

## 2022-04-09 DIAGNOSIS — D8686 Sarcoid arthropathy: Secondary | ICD-10-CM | POA: Diagnosis not present

## 2022-04-09 DIAGNOSIS — D8689 Sarcoidosis of other sites: Secondary | ICD-10-CM | POA: Diagnosis not present

## 2022-04-09 DIAGNOSIS — D860 Sarcoidosis of lung: Secondary | ICD-10-CM | POA: Diagnosis not present

## 2022-04-09 DIAGNOSIS — D869 Sarcoidosis, unspecified: Secondary | ICD-10-CM | POA: Diagnosis not present

## 2022-04-09 DIAGNOSIS — F411 Generalized anxiety disorder: Secondary | ICD-10-CM | POA: Diagnosis not present

## 2022-04-09 DIAGNOSIS — G8929 Other chronic pain: Secondary | ICD-10-CM | POA: Diagnosis not present

## 2022-04-09 DIAGNOSIS — R079 Chest pain, unspecified: Secondary | ICD-10-CM | POA: Diagnosis not present

## 2022-04-09 DIAGNOSIS — Z79899 Other long term (current) drug therapy: Secondary | ICD-10-CM | POA: Diagnosis not present

## 2022-04-12 ENCOUNTER — Ambulatory Visit
Admission: EM | Admit: 2022-04-12 | Discharge: 2022-04-12 | Disposition: A | Discharge status: Home / Self Care | Payer: 59 | Attending: Physician Assistant | Admitting: Physician Assistant

## 2022-04-12 ENCOUNTER — Encounter: Payer: Self-pay | Admitting: Emergency Medicine

## 2022-04-12 DIAGNOSIS — R1030 Lower abdominal pain, unspecified: Secondary | ICD-10-CM | POA: Diagnosis not present

## 2022-04-12 DIAGNOSIS — B3731 Acute candidiasis of vulva and vagina: Secondary | ICD-10-CM

## 2022-04-12 DIAGNOSIS — G4733 Obstructive sleep apnea (adult) (pediatric): Secondary | ICD-10-CM | POA: Diagnosis not present

## 2022-04-12 LAB — URINALYSIS, W/ REFLEX TO CULTURE (INFECTION SUSPECTED)
Bilirubin Urine: NEGATIVE
Glucose, UA: NEGATIVE mg/dL
Ketones, ur: NEGATIVE mg/dL
Leukocytes,Ua: NEGATIVE
Nitrite: NEGATIVE
Protein, ur: NEGATIVE mg/dL
Specific Gravity, Urine: 1.025 (ref 1.005–1.030)
pH: 6 (ref 5.0–8.0)

## 2022-04-12 LAB — WET PREP, GENITAL
Clue Cells Wet Prep HPF POC: NONE SEEN
Sperm: NONE SEEN
Trich, Wet Prep: NONE SEEN
WBC, Wet Prep HPF POC: >10 — AB (ref <10)
Yeast Wet Prep HPF POC: NONE SEEN

## 2022-04-12 LAB — PREGNANCY, URINE: Preg Test, Ur: NEGATIVE

## 2022-04-12 MED ORDER — FLUCONAZOLE 150 MG PO TABS: 150.0000 mg | ORAL_TABLET | Freq: Every day | ORAL | 0 refills | Status: AC

## 2022-04-12 NOTE — ED Triage Notes (Signed)
Patient c/o abdominal pressure and urinary frequency that started a week ago.  Patient denies fevers.

## 2022-04-12 NOTE — Discharge Instructions (Addendum)

## 2022-04-12 NOTE — ED Provider Notes (Signed)
[facility]    CSN: [id number] Arrival date & time: 04/12/22  [id number]      History   Chief Complaint Chief Complaint  Patient presents with   Abdominal Pain   Vaginal Discharge    HPI MONTA ZISMAN is a 37 y.o. female presenting for approximately 1 week history of lower abdominal cramping and urinary frequency.  She denies fever, chills, flank pain, vaginal discharge or odor.  Reports she had vaginal itching a few days ago but it went away. No n/v/d or constipation.  Denies any concern for pregnancy as she is on Depo.  Patient reports she has not received her menstrual period in a long time due to being on Depo.  Denies any concern for STIs.  Has tried over-the-counter Tylenol for the cramping.  Patient reports that she has a history of sarcoidosis and receives IV infusions.  No other complaints.  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   Prediabetes 12/22/2019   Depression 12/21/2019   Hemorrhoids 12/21/2019   Migraines 12/21/2019   Anxiety 09/23/2017   Tension type headache 09/02/2017   Well adult exam 09/02/2017    Past Surgical History:  Procedure Laterality Date   NO PAST SURGERIES     VIDEO BRONCHOSCOPY WITH ENDOBRONCHIAL ULTRASOUND N/A 09/28/2020   Procedure: VIDEO BRONCHOSCOPY WITH ENDOBRONCHIAL ULTRASOUND;  Surgeon: Ottie Glazier, MD;  Location: [facility] ORS;  Service: Thoracic;  Laterality: N/A;    OB History     Gravida  2   Para      Term      Preterm      AB  1   Living  1      SAB      IAB      Ectopic      Multiple      Live Births               Home Medications    Prior to Admission medications   Medication Sig Start Date End Date Taking? Authorizing Provider  amLODipine (NORVASC) 5 MG tablet  Take 5 mg by mouth daily. 07/04/20  Yes [provider]  amphetamine-dextroamphetamine (ADDERALL XR) 15 MG 24 hr capsule Take by mouth every morning. 11/12/21  Yes [provider]  budesonide-formoterol (SYMBICORT) 80-4.5 MCG/ACT inhaler Inhale into the lungs. 03/17/22 03/17/23 Yes [provider]  busPIRone (BUSPAR) 15 MG tablet Take 1 tablet by mouth 2 (two) times daily. 01/17/22  Yes [provider]  DULoxetine (CYMBALTA) 60 MG capsule Take 1 tablet by mouth daily. 03/20/22  Yes [provider]  fluconazole (DIFLUCAN) 150 MG tablet Take 1 tablet (150 mg total) by mouth daily for 1 day. 04/12/22 04/13/22 Yes Laurene Footman B, PA-C  gabapentin (NEURONTIN) 100 MG capsule TAKE 1 TO 2 CAPSULES BY MOUTH NIGHTLY AND CAN TAKE 1 ADDITIONAL CAPSULE NIGHTLY AS NEEDED FOR PAIN 01/06/22  Yes [provider]  hydrochlorothiazide (HYDRODIURIL) 12.5 MG tablet Take 12.5 mg by mouth daily. 03/20/19  Yes [provider]  MedroxyPROGESTERone Acetate 150 MG/ML SUSY Inject 1 mL (150 mg total) as directed once. Patient taking differently: Inject 150 mg as directed every 3 (three) months. 10/08/16 0000000 Yes Copland, Elmo Putt B, PA-C  metoprolol succinate (  TOPROL-XL) 50 MG 24 hr tablet Take 1 tablet by mouth daily. 04/01/22 04/01/23 Yes [provider]  nortriptyline (PAMELOR) 10 MG capsule Take 1 capsule by mouth every morning. 04/09/21  Yes [provider]  omeprazole (PRILOSEC) 20 MG capsule Take by mouth. 04/01/22 04/01/23 Yes [provider]  tiZANidine (ZANAFLEX) 2 MG tablet TAKE 1 TABLET (2 MG TOTAL) BY MOUTH 3 (THREE) TIMES DAILY AS NEEDED FOR UP TO 90 DAYS 01/29/22  Yes [provider]  traZODone (DESYREL) 100 MG tablet Take 100 mg by mouth at bedtime. 08/30/20  Yes [provider]  albuterol (VENTOLIN HFA) 108 (90 Base) MCG/ACT inhaler Inhale 2 puffs into the lungs every 6 (six) hours as needed for wheezing or shortness of breath.     [provider]  buPROPion (WELLBUTRIN XL) 150 MG 24 hr tablet Take 150 mg by mouth daily. 07/04/20   [provider]  cetirizine (ZYRTEC) 10 MG tablet Take 10 mg by mouth daily as needed for allergies.    [provider]  fluticasone (FLONASE) 50 MCG/ACT nasal spray Place 1 spray into both nostrils daily as needed for allergies. 04/16/18   [provider]  hydrOXYzine (ATARAX/VISTARIL) 25 MG tablet Take 25 mg by mouth at bedtime. 09/05/20   [provider]  losartan (COZAAR) 25 MG tablet Take 25 mg by mouth daily.    [provider]  naproxen (NAPROSYN) 500 MG tablet Take 500 mg by mouth 2 (two) times daily.    [provider]  nortriptyline (PAMELOR) 50 MG capsule Take 50 mg by mouth at bedtime.    [provider]  potassium chloride SA (KLOR-CON M) 20 MEQ tablet Take 1 tablet (20 mEq total) by mouth 3 (three) times daily. 12/26/21   Duanne Guess, PA-C  venlafaxine XR (EFFEXOR-XR) 150 MG 24 hr capsule Take 150 mg by mouth daily with breakfast.    [provider]  WEGOVY 0.5 MG/0.5ML SOAJ Inject into the skin.    [provider]    Family History Family History  Problem Relation Age of Onset   Healthy Mother     Social History Social History   Tobacco Use   Smoking status: Former    Packs/day: 0.50    Types: Cigarettes    Quit date: 07/2018    Years since quitting: 3.7   Smokeless tobacco: Never  Vaping Use   Vaping Use: Never used  Substance Use Topics   Alcohol use: Yes    Comment: socially   Drug use: No     Allergies   Patient has no known allergies.   Review of Systems Review of Systems  Constitutional:  Negative for chills, fatigue and fever.  Respiratory:  Negative for shortness of breath.   Cardiovascular:  Negative for chest pain.  Gastrointestinal:  Positive for abdominal pain. Negative for diarrhea, nausea and vomiting.  Genitourinary:  Positive for frequency.  Negative for decreased urine volume, dysuria, flank pain, hematuria, pelvic pain, urgency, vaginal bleeding, vaginal discharge and vaginal pain.  Musculoskeletal:  Negative for back pain.  Skin:  Negative for rash.     Physical Exam Triage Vital Signs ED Triage Vitals  Enc Vitals Group     BP      Pulse      Resp      Temp      Temp src      SpO2      Weight      Height  Head Circumference      Peak Flow      Pain Score      Pain Loc      Pain Edu?      Excl. in [place]?    No data found.  Updated Vital Signs BP 122/85 (BP Location: Left Arm)   Pulse (!) 103   Temp 98.4 F (36.9 C) (Oral)   Resp 15   Ht '5\' 5"'$  (1.651 m)   Wt 208 lb (94.3 kg)   SpO2 97%   BMI 34.61 kg/m     Physical Exam Vitals and nursing note reviewed.  Constitutional:      General: She is not in acute distress.    Appearance: Normal appearance. She is not ill-appearing or toxic-appearing.  HENT:     Head: Normocephalic and atraumatic.  Eyes:     General: No scleral icterus.       Right eye: No discharge.        Left eye: No discharge.     Conjunctiva/sclera: Conjunctivae normal.  Cardiovascular:     Rate and Rhythm: Normal rate and regular rhythm.     Heart sounds: Normal heart sounds.  Pulmonary:     Effort: Pulmonary effort is normal. No respiratory distress.     Breath sounds: Normal breath sounds.  Abdominal:     Palpations: Abdomen is soft.     Tenderness: There is abdominal tenderness (LLQ). There is no right CVA tenderness, left CVA tenderness or guarding.  Musculoskeletal:     Cervical back: Neck supple.  Skin:    General: Skin is dry.  Neurological:     General: No focal deficit present.     Mental Status: She is alert. Mental status is at baseline.     Motor: No weakness.     Gait: Gait normal.  Psychiatric:        Mood and Affect: Mood normal.        Behavior: Behavior normal.        Thought Content: Thought content normal.      UC Treatments / Results   Labs (all labs ordered are listed, but only abnormal results are displayed) Labs Reviewed  WET PREP, GENITAL - Abnormal; Notable for the following components:      Result Value   WBC, Wet Prep HPF POC >10 (*)    All other components within normal limits  URINALYSIS, W/ REFLEX TO CULTURE (INFECTION SUSPECTED) - Abnormal; Notable for the following components:   Hgb urine dipstick TRACE (*)    Bacteria, UA MANY (*)    All other components within normal limits  PREGNANCY, URINE    EKG   Radiology No results found.  Procedures Procedures (including critical care time)  Medications Ordered in UC Medications - No data to display  Initial Impression / Assessment and Plan / UC Course  I have reviewed the triage vital signs and the nursing notes.  Pertinent labs & imaging results that were available during my care of the patient were reviewed by me and considered in my medical decision making (see chart for details).   37 year old female presents for right lower abdominal cramping and urinary frequency for the past 1 week.  Vaginal itching a few days ago but it went away.  No other symptoms.  Patient prefers to forego the pelvic exam and perform vaginal self swab.  Wet prep is negative.  Urine pregnancy negative.  Urinalysis without evidence of UTI but does show budding yeast.  Will  treat patient for yeast infection.  Sent Diflucan to pharmacy.  Encouraged use of Tylenol and Midol for the cramping but if the pain gets worse or fever is developed or any acute worsening symptoms go to ER.   Final Clinical Impressions(s) / UC Diagnoses   Final diagnoses:  Yeast vaginitis  Lower abdominal pain     Discharge Instructions      The most common types of vaginal infections are yeast infections and bacterial vaginosis. Neither of which are really considered to be sexually transmitted. Often a pH swab or wet prep is performed and if abnormal may reveal either type of infection. Begin  metronidazole if prescribed for possible BV infection. If there is concern for yeast infection, fluconazole is often prescribed . Take this as directed. You may also apply topical miconazole (can be purchased OTC) externally for relief of itching. Increase rest and fluid intake. If labs sent out, we will call within 2-5 days with results and amend treatment if necessary. Always try to use pH balanced washes/wipes, urinate after intercourse, stay hydrated, and take probiotics if you are prone to vaginal infections. Return or see PCP or gynecologist for new/worsening infections.       ED Prescriptions     Medication Sig Dispense Auth. Provider   fluconazole (DIFLUCAN) 150 MG tablet Take 1 tablet (150 mg total) by mouth daily for 1 day. 1 tablet Gretta Cool      PDMP not reviewed this encounter.   Danton Clap, PA-C 04/12/22 1023

## 2022-04-13 DIAGNOSIS — G4733 Obstructive sleep apnea (adult) (pediatric): Secondary | ICD-10-CM | POA: Diagnosis not present

## 2022-04-15 ENCOUNTER — Other Ambulatory Visit: Payer: Self-pay | Admitting: Rheumatology

## 2022-04-15 DIAGNOSIS — D860 Sarcoidosis of lung: Secondary | ICD-10-CM

## 2022-04-15 DIAGNOSIS — R42 Dizziness and giddiness: Secondary | ICD-10-CM | POA: Diagnosis not present

## 2022-04-15 DIAGNOSIS — J340 Abscess, furuncle and carbuncle of nose: Secondary | ICD-10-CM | POA: Diagnosis not present

## 2022-04-15 DIAGNOSIS — D8689 Sarcoidosis of other sites: Secondary | ICD-10-CM

## 2022-04-15 DIAGNOSIS — R040 Epistaxis: Secondary | ICD-10-CM | POA: Diagnosis not present

## 2022-04-15 DIAGNOSIS — D8686 Sarcoid arthropathy: Secondary | ICD-10-CM

## 2022-04-15 DIAGNOSIS — R748 Abnormal levels of other serum enzymes: Secondary | ICD-10-CM

## 2022-04-15 DIAGNOSIS — D869 Sarcoidosis, unspecified: Secondary | ICD-10-CM

## 2022-04-15 DIAGNOSIS — M791 Myalgia, unspecified site: Secondary | ICD-10-CM

## 2022-04-17 DIAGNOSIS — N898 Other specified noninflammatory disorders of vagina: Secondary | ICD-10-CM | POA: Diagnosis not present

## 2022-04-17 DIAGNOSIS — Z01419 Encounter for gynecological examination (general) (routine) without abnormal findings: Secondary | ICD-10-CM | POA: Diagnosis not present

## 2022-04-21 ENCOUNTER — Ambulatory Visit
Admission: RE | Admit: 2022-04-21 | Discharge: 2022-04-21 | Disposition: A | Discharge status: Home / Self Care | Payer: 59 | Source: Ambulatory Visit | Attending: Rheumatology | Admitting: Rheumatology

## 2022-04-21 DIAGNOSIS — D860 Sarcoidosis of lung: Secondary | ICD-10-CM | POA: Diagnosis not present

## 2022-04-21 DIAGNOSIS — D8689 Sarcoidosis of other sites: Secondary | ICD-10-CM | POA: Insufficient documentation

## 2022-04-21 DIAGNOSIS — D8686 Sarcoid arthropathy: Secondary | ICD-10-CM | POA: Diagnosis not present

## 2022-04-21 DIAGNOSIS — M791 Myalgia, unspecified site: Secondary | ICD-10-CM | POA: Insufficient documentation

## 2022-04-21 DIAGNOSIS — D869 Sarcoidosis, unspecified: Secondary | ICD-10-CM | POA: Diagnosis not present

## 2022-04-21 DIAGNOSIS — R748 Abnormal levels of other serum enzymes: Secondary | ICD-10-CM | POA: Diagnosis not present

## 2022-04-21 DIAGNOSIS — M79651 Pain in right thigh: Secondary | ICD-10-CM | POA: Diagnosis not present

## 2022-04-23 ENCOUNTER — Ambulatory Visit
Admission: RE | Admit: 2022-04-23 | Discharge: 2022-04-23 | Disposition: A | Discharge status: Home / Self Care | Payer: 59 | Source: Ambulatory Visit | Attending: Internal Medicine | Admitting: Internal Medicine

## 2022-04-23 DIAGNOSIS — R911 Solitary pulmonary nodule: Secondary | ICD-10-CM | POA: Diagnosis not present

## 2022-04-23 DIAGNOSIS — D862 Sarcoidosis of lung with sarcoidosis of lymph nodes: Secondary | ICD-10-CM

## 2022-04-28 DIAGNOSIS — D860 Sarcoidosis of lung: Secondary | ICD-10-CM | POA: Diagnosis not present

## 2022-04-29 DIAGNOSIS — F1211 Cannabis abuse, in remission: Secondary | ICD-10-CM | POA: Diagnosis not present

## 2022-04-29 DIAGNOSIS — F5105 Insomnia due to other mental disorder: Secondary | ICD-10-CM | POA: Diagnosis not present

## 2022-04-29 DIAGNOSIS — F331 Major depressive disorder, recurrent, moderate: Secondary | ICD-10-CM | POA: Diagnosis not present

## 2022-04-29 DIAGNOSIS — F1011 Alcohol abuse, in remission: Secondary | ICD-10-CM | POA: Diagnosis not present

## 2022-04-29 DIAGNOSIS — D518 Other vitamin B12 deficiency anemias: Secondary | ICD-10-CM | POA: Diagnosis not present

## 2022-04-29 DIAGNOSIS — F411 Generalized anxiety disorder: Secondary | ICD-10-CM | POA: Diagnosis not present

## 2022-05-01 DIAGNOSIS — Z3042 Encounter for surveillance of injectable contraceptive: Secondary | ICD-10-CM | POA: Diagnosis not present

## 2022-05-07 DIAGNOSIS — J301 Allergic rhinitis due to pollen: Secondary | ICD-10-CM | POA: Diagnosis not present

## 2022-05-07 DIAGNOSIS — D860 Sarcoidosis of lung: Secondary | ICD-10-CM | POA: Diagnosis not present

## 2022-05-07 DIAGNOSIS — R0602 Shortness of breath: Secondary | ICD-10-CM | POA: Diagnosis not present

## 2022-05-13 DIAGNOSIS — G4733 Obstructive sleep apnea (adult) (pediatric): Secondary | ICD-10-CM | POA: Diagnosis not present

## 2022-05-14 DIAGNOSIS — G4733 Obstructive sleep apnea (adult) (pediatric): Secondary | ICD-10-CM | POA: Diagnosis not present

## 2022-05-14 DIAGNOSIS — R0683 Snoring: Secondary | ICD-10-CM | POA: Diagnosis not present

## 2022-05-15 DIAGNOSIS — F411 Generalized anxiety disorder: Secondary | ICD-10-CM | POA: Diagnosis not present

## 2022-05-15 DIAGNOSIS — F1011 Alcohol abuse, in remission: Secondary | ICD-10-CM | POA: Diagnosis not present

## 2022-05-15 DIAGNOSIS — D518 Other vitamin B12 deficiency anemias: Secondary | ICD-10-CM | POA: Diagnosis not present

## 2022-05-15 DIAGNOSIS — F1211 Cannabis abuse, in remission: Secondary | ICD-10-CM | POA: Diagnosis not present

## 2022-05-15 DIAGNOSIS — F5105 Insomnia due to other mental disorder: Secondary | ICD-10-CM | POA: Diagnosis not present

## 2022-05-15 DIAGNOSIS — F331 Major depressive disorder, recurrent, moderate: Secondary | ICD-10-CM | POA: Diagnosis not present

## 2022-05-21 DIAGNOSIS — F419 Anxiety disorder, unspecified: Secondary | ICD-10-CM | POA: Diagnosis not present

## 2022-05-21 DIAGNOSIS — E6609 Other obesity due to excess calories: Secondary | ICD-10-CM | POA: Diagnosis not present

## 2022-05-21 DIAGNOSIS — R079 Chest pain, unspecified: Secondary | ICD-10-CM | POA: Diagnosis not present

## 2022-05-21 DIAGNOSIS — D860 Sarcoidosis of lung: Secondary | ICD-10-CM | POA: Diagnosis not present

## 2022-05-21 DIAGNOSIS — Z6834 Body mass index (BMI) 34.0-34.9, adult: Secondary | ICD-10-CM | POA: Diagnosis not present

## 2022-05-21 DIAGNOSIS — I10 Essential (primary) hypertension: Secondary | ICD-10-CM | POA: Diagnosis not present

## 2022-05-21 DIAGNOSIS — G4733 Obstructive sleep apnea (adult) (pediatric): Secondary | ICD-10-CM | POA: Diagnosis not present

## 2022-05-21 DIAGNOSIS — R0602 Shortness of breath: Secondary | ICD-10-CM | POA: Diagnosis not present

## 2022-05-21 DIAGNOSIS — R0789 Other chest pain: Secondary | ICD-10-CM | POA: Diagnosis not present

## 2022-05-21 DIAGNOSIS — R600 Localized edema: Secondary | ICD-10-CM | POA: Diagnosis not present

## 2022-05-21 DIAGNOSIS — D869 Sarcoidosis, unspecified: Secondary | ICD-10-CM | POA: Diagnosis not present

## 2022-05-22 ENCOUNTER — Other Ambulatory Visit: Payer: Self-pay | Admitting: Gerontology

## 2022-05-22 DIAGNOSIS — D869 Sarcoidosis, unspecified: Secondary | ICD-10-CM

## 2022-05-22 DIAGNOSIS — F411 Generalized anxiety disorder: Secondary | ICD-10-CM | POA: Diagnosis not present

## 2022-05-27 DIAGNOSIS — J340 Abscess, furuncle and carbuncle of nose: Secondary | ICD-10-CM | POA: Diagnosis not present

## 2022-05-27 DIAGNOSIS — F411 Generalized anxiety disorder: Secondary | ICD-10-CM | POA: Diagnosis not present

## 2022-05-29 DIAGNOSIS — R079 Chest pain, unspecified: Secondary | ICD-10-CM | POA: Diagnosis not present

## 2022-05-29 DIAGNOSIS — R0602 Shortness of breath: Secondary | ICD-10-CM | POA: Diagnosis not present

## 2022-06-03 DIAGNOSIS — F411 Generalized anxiety disorder: Secondary | ICD-10-CM | POA: Diagnosis not present

## 2022-06-10 ENCOUNTER — Telehealth (HOSPITAL_COMMUNITY): Payer: Self-pay | Admitting: *Deleted

## 2022-06-10 NOTE — Telephone Encounter (Signed)
Attempted to call patient regarding upcoming cardiac MRI appointment. Left message on voicemail with name and callback number  Heylee Tant RN Navigator Cardiac Imaging [facility] [phone] Office [phone] Cell  

## 2022-06-11 ENCOUNTER — Other Ambulatory Visit: Payer: Self-pay | Admitting: Gerontology

## 2022-06-11 ENCOUNTER — Ambulatory Visit
Admission: RE | Admit: 2022-06-11 | Discharge: 2022-06-11 | Disposition: A | Discharge status: Home / Self Care | Payer: 59 | Source: Ambulatory Visit | Attending: Gerontology | Admitting: Gerontology

## 2022-06-11 DIAGNOSIS — D869 Sarcoidosis, unspecified: Secondary | ICD-10-CM | POA: Diagnosis not present

## 2022-06-11 DIAGNOSIS — D8689 Sarcoidosis of other sites: Secondary | ICD-10-CM | POA: Diagnosis not present

## 2022-06-11 DIAGNOSIS — Z79899 Other long term (current) drug therapy: Secondary | ICD-10-CM | POA: Diagnosis not present

## 2022-06-11 DIAGNOSIS — D8686 Sarcoid arthropathy: Secondary | ICD-10-CM | POA: Diagnosis not present

## 2022-06-11 DIAGNOSIS — D860 Sarcoidosis of lung: Secondary | ICD-10-CM | POA: Diagnosis not present

## 2022-06-11 DIAGNOSIS — R52 Pain, unspecified: Secondary | ICD-10-CM | POA: Diagnosis not present

## 2022-06-11 MED ORDER — GADOBUTROL 1 MMOL/ML IV SOLN
12.0000 mL | Freq: Once | INTRAVENOUS | Status: AC | PRN
  Administered 2022-06-11: 12 mL via INTRAVENOUS

## 2022-06-12 DIAGNOSIS — F411 Generalized anxiety disorder: Secondary | ICD-10-CM | POA: Diagnosis not present

## 2022-06-12 DIAGNOSIS — F5105 Insomnia due to other mental disorder: Secondary | ICD-10-CM | POA: Diagnosis not present

## 2022-06-12 DIAGNOSIS — F1011 Alcohol abuse, in remission: Secondary | ICD-10-CM | POA: Diagnosis not present

## 2022-06-12 DIAGNOSIS — D518 Other vitamin B12 deficiency anemias: Secondary | ICD-10-CM | POA: Diagnosis not present

## 2022-06-12 DIAGNOSIS — F1211 Cannabis abuse, in remission: Secondary | ICD-10-CM | POA: Diagnosis not present

## 2022-06-12 DIAGNOSIS — F331 Major depressive disorder, recurrent, moderate: Secondary | ICD-10-CM | POA: Diagnosis not present

## 2022-06-13 DIAGNOSIS — R0789 Other chest pain: Secondary | ICD-10-CM | POA: Diagnosis not present

## 2022-06-13 DIAGNOSIS — R0683 Snoring: Secondary | ICD-10-CM | POA: Diagnosis not present

## 2022-06-13 DIAGNOSIS — G4733 Obstructive sleep apnea (adult) (pediatric): Secondary | ICD-10-CM | POA: Diagnosis not present

## 2022-06-13 DIAGNOSIS — R0602 Shortness of breath: Secondary | ICD-10-CM | POA: Diagnosis not present

## 2022-06-17 ENCOUNTER — Ambulatory Visit
Admission: EM | Admit: 2022-06-17 | Discharge: 2022-06-17 | Disposition: A | Discharge status: Home / Self Care | Payer: 59 | Attending: Physician Assistant | Admitting: Physician Assistant

## 2022-06-17 ENCOUNTER — Ambulatory Visit (INDEPENDENT_AMBULATORY_CARE_PROVIDER_SITE_OTHER): Payer: 59

## 2022-06-17 DIAGNOSIS — M25531 Pain in right wrist: Secondary | ICD-10-CM | POA: Diagnosis not present

## 2022-06-17 DIAGNOSIS — G4733 Obstructive sleep apnea (adult) (pediatric): Secondary | ICD-10-CM | POA: Diagnosis not present

## 2022-06-17 DIAGNOSIS — Z862 Personal history of diseases of the blood and blood-forming organs and certain disorders involving the immune mechanism: Secondary | ICD-10-CM | POA: Diagnosis not present

## 2022-06-17 MED ORDER — HYDROCODONE-ACETAMINOPHEN 5-325 MG PO TABS: 1.0000 | ORAL_TABLET | Freq: Four times a day (QID) | ORAL | 0 refills | Status: AC | PRN

## 2022-06-17 NOTE — ED Triage Notes (Signed)
Pt c/o right wrist pain x2-9months  Pt states that she has Sarcoidosis and was told by her rheumatologist that she may have Fibro myalgia.   Pt states that when she tries to use her wrist she gets a sharp pain going up her arm to mid forearm.   Pt denies any tingling or pain in her fingers.

## 2022-06-17 NOTE — Discharge Instructions (Addendum)
-  X-ray is normal. You need more work up of this condition. -Wear wrist brace and continue home meds. Hold off on Tylenol if you take the prescription narcotic pain meds.  -Check on referral to pain management. Any further pain meds will need to come from them if prescribed.

## 2022-06-17 NOTE — ED Provider Notes (Signed)
[facility]    CSN: [id number] Arrival date & time: 06/17/22  1615      History   Chief Complaint Chief Complaint  Patient presents with   Wrist Pain    Entered by patient    HPI  Samantha [name] is a 37 y.o. female with history of sarcoidosis with pulmonary/neurologic/musculoskeletal involvement, hypertension, anxiety, depression, and migraines.  She presents today for severe right wrist pain x 2-3 months. Denies injury. She reports worsening wrist pain with activity, but it improves a little when wearing a wrist brace. Works as a Associate Professor.  She says she has a lot of increased pain when she is gripping, grabbing and typing.  She is right handed. She denies any numbness, tingling or weakness and no previous diagnosis of carpal tunnel syndrome.  Patient was seen by rheumatology 6 days ago for sarcoidosis and complications. Specialist did not feel her wrist pain was related to sarcoid arthritis and has concerns for fibromyalgia. She currently takes "remicade, cymbalta 60, gabapentin 200mg  qhs, nortriptyline 50mg  qhs, imuran 50mg  qam and 100mg  qhs, tizanidine 4mg  qhs." Has also been taking Naproxen BID and Tylenol PRN.  Referral has been placed to pain management but she is waiting for a call.  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   Prediabetes 12/22/2019   Depression 12/21/2019   Hemorrhoids 12/21/2019   Migraines 12/21/2019   Anxiety 09/23/2017   Tension type headache 09/02/2017   Well adult exam 09/02/2017    Past Surgical History:  Procedure Laterality Date   NO PAST SURGERIES     VIDEO BRONCHOSCOPY WITH ENDOBRONCHIAL ULTRASOUND N/A 09/28/2020   Procedure: VIDEO BRONCHOSCOPY WITH ENDOBRONCHIAL ULTRASOUND;  Surgeon: Vida Rigger,  MD;  Location: [facility] ORS;  Service: Thoracic;  Laterality: N/A;    OB History     Gravida  2   Para      Term      Preterm      AB  1   Living  1      SAB      IAB      Ectopic      Multiple      Live Births               Home Medications    Prior to Admission medications   Medication Sig Start Date End Date Taking? Authorizing Provider  amLODipine (NORVASC) 5 MG tablet Take 5 mg by mouth daily. 07/04/20  Yes [provider]  amphetamine-dextroamphetamine (ADDERALL XR) 15 MG 24 hr capsule Take by mouth every morning. 11/12/21  Yes [provider]  budesonide-formoterol (SYMBICORT) 80-4.5 MCG/ACT inhaler Inhale into the lungs. 03/17/22 03/17/23 Yes [provider]  cetirizine (ZYRTEC) 10 MG tablet Take 10 mg by mouth daily as needed for allergies.   Yes [provider]  DULoxetine (CYMBALTA) 60 MG capsule Take 1 tablet by mouth daily. 03/20/22  Yes [provider]  gabapentin (NEURONTIN) 100 MG capsule TAKE 1 TO 2 CAPSULES BY MOUTH NIGHTLY AND CAN TAKE 1 ADDITIONAL CAPSULE NIGHTLY AS NEEDED FOR PAIN 01/06/22  Yes [provider]  hydrochlorothiazide (HYDRODIURIL) 12.5 MG tablet Take 12.5 mg by mouth daily. 03/20/19  Yes [provider]  HYDROcodone-acetaminophen (NORCO/VICODIN)  5-325 MG tablet Take 1 tablet by mouth every 6 (six) hours as needed for up to 3 days for severe pain. 06/17/22 06/20/22 Yes Shirlee Latch, PA-C  losartan (COZAAR) 25 MG tablet Take 25 mg by mouth daily.   Yes [provider]  metoprolol succinate (TOPROL-XL) 50 MG 24 hr tablet Take 1 tablet by mouth daily. 04/01/22 04/01/23 Yes [provider]  naproxen (NAPROSYN) 500 MG tablet Take 500 mg by mouth 2 (two) times daily.   Yes [provider]  nortriptyline (PAMELOR) 10 MG capsule Take 1 capsule by mouth every morning. 04/09/21  Yes [provider]  nortriptyline (PAMELOR) 50 MG capsule Take 50 mg by mouth at  bedtime.   Yes [provider]  omeprazole (PRILOSEC) 20 MG capsule Take by mouth. 04/01/22 04/01/23 Yes [provider]  tiZANidine (ZANAFLEX) 2 MG tablet TAKE 1 TABLET (2 MG TOTAL) BY MOUTH 3 (THREE) TIMES DAILY AS NEEDED FOR UP TO 90 DAYS 01/29/22  Yes [provider]  traZODone (DESYREL) 100 MG tablet Take 100 mg by mouth at bedtime. 08/30/20  Yes [provider]  albuterol (VENTOLIN HFA) 108 (90 Base) MCG/ACT inhaler Inhale 2 puffs into the lungs every 6 (six) hours as needed for wheezing or shortness of breath.    [provider]  buPROPion (WELLBUTRIN XL) 150 MG 24 hr tablet Take 150 mg by mouth daily. 07/04/20   [provider]  busPIRone (BUSPAR) 15 MG tablet Take 1 tablet by mouth 2 (two) times daily. 01/17/22   [provider]  fluticasone (FLONASE) 50 MCG/ACT nasal spray Place 1 spray into both nostrils daily as needed for allergies. 04/16/18   [provider]  hydrOXYzine (ATARAX/VISTARIL) 25 MG tablet Take 25 mg by mouth at bedtime. 09/05/20   [provider]  MedroxyPROGESTERone Acetate 150 MG/ML SUSY Inject 1 mL (150 mg total) as directed once. Patient taking differently: Inject 150 mg as directed every 3 (three) months. 10/08/16 04/12/22  Copland, Helmut Muster B, PA-C  potassium chloride SA (KLOR-CON M) 20 MEQ tablet Take 1 tablet (20 mEq total) by mouth 3 (three) times daily. 12/26/21   Evon Slack, PA-C  venlafaxine XR (EFFEXOR-XR) 150 MG 24 hr capsule Take 150 mg by mouth daily with breakfast.    [provider]  WEGOVY 0.5 MG/0.5ML SOAJ Inject into the skin.    [provider]    Family History Family History  Problem Relation Age of Onset   Healthy Mother     Social History Social History   Tobacco Use   Smoking status: Former    Packs/day: .5    Types: Cigarettes    Quit date: 07/2018    Years since quitting: 3.9   Smokeless tobacco: Never  Vaping Use   Vaping Use: Never used   Substance Use Topics   Alcohol use: Yes    Comment: socially   Drug use: No     Allergies   Patient has no known allergies.   Review of Systems Review of Systems  Musculoskeletal:  Positive for arthralgias and joint swelling.  Skin:  Negative for color change and wound.  Neurological:  Negative for weakness and numbness.     Physical Exam Triage Vital Signs ED Triage Vitals  Enc Vitals Group     BP      Pulse      Resp      Temp      Temp src      SpO2  Weight      Height      Head Circumference      Peak Flow      Pain Score      Pain Loc      Pain Edu?      Excl. in [place]?    No data found.  Updated Vital Signs BP 91/63 (BP Location: Left Arm)   Pulse 96   Temp 98.7 F (37.1 C) (Oral)   Ht 5\' 5"  (1.651 m)   Wt 205 lb (93 kg)   SpO2 95%   BMI 34.11 kg/m   Physical Exam Vitals and nursing note reviewed.  Constitutional:      General: She is not in acute distress.    Appearance: Normal appearance. She is not ill-appearing or toxic-appearing.  HENT:     Head: Normocephalic and atraumatic.  Eyes:     General: No scleral icterus.       Right eye: No discharge.        Left eye: No discharge.     Conjunctiva/sclera: Conjunctivae normal.  Cardiovascular:     Rate and Rhythm: Normal rate and regular rhythm.     Pulses: Normal pulses.  Pulmonary:     Effort: Pulmonary effort is normal. No respiratory distress.  Musculoskeletal:     Right wrist: Swelling (mild swelling wrist) and bony tenderness (distal radial wrist) present. No deformity or snuff box tenderness. Normal range of motion. Normal pulse.     Cervical back: Neck supple.  Skin:    General: Skin is dry.  Neurological:     General: No focal deficit present.     Mental Status: She is alert. Mental status is at baseline.     Motor: No weakness.     Gait: Gait normal.  Psychiatric:        Mood and Affect: Mood normal.        Behavior: Behavior normal.        Thought Content: Thought  content normal.      UC Treatments / Results  Labs (all labs ordered are listed, but only abnormal results are displayed) Labs Reviewed - No data to display  EKG   Radiology DG Wrist Complete Right  Result Date: 06/17/2022 CLINICAL DATA:  Pain for 2-3 months without trauma EXAM: RIGHT WRIST - COMPLETE 3+ VIEW COMPARISON:  None Available. FINDINGS: No acute fracture or dislocation. Joint spaces maintained. Scaphoid intact. IMPRESSION: No acute osseous abnormality. Electronically Signed   By: Jeronimo Greaves M.D.   On: 06/17/2022 17:12    Procedures Procedures (including critical care time)  Medications Ordered in UC Medications - No data to display  Initial Impression / Assessment and Plan / UC Course  I have reviewed the triage vital signs and the nursing notes.  Pertinent labs & imaging results that were available during my care of the patient were reviewed by me and considered in my medical decision making (see chart for details).  37 year old female with history of sarcoidosis presents for severe right wrist pain x 2 to 3 months which has steadily worsened.  She says she has never had any imaging or workup of the pain.  Has been taking naproxen, Tylenol, gabapentin, Cymbalta and tizanidine without relief.  Has been wearing a soft wrist brace with mild improvement in the discomfort.   Per rheumatology 06/11/22:  "She is currently taking remicade, cymbalta 60, gabapentin 200mg  qhs, nortriptyline 50mg  qhs, imuran 50mg  qam and 100mg  qhs, tizanidine 4mg  qhs. "  "Interval  history, physical, and extensive work-up suggest good control of her sarcoidosis despite ongoing pain symptoms. Given the behavior of her pains and chronology of pain onset vs the disease activity of her other sarcoidosis organ involvement, her ongoing pains do not appear due to sarcoid arthritis at this juncture, although this cannot be fully excluded at this point. There is not currently sufficient evidence of sarcoid  arthritis to justify further escalation of her immunosuppression. The true etiology of her pain symptoms remains unclear, although fibromyalgia is on the differential. We will maintain her remicade, imuran, and neurologic pain regimen as is between now and next visit and refer her to a pain specialist for additional investigation and consideration of other treatment options."  X-ray of the right wrist obtained today. Normal x-ray.  Patient given thumb spica wrist brace.  Patient reported that this brace was very helpful and more supportive than her soft brace.  Reviewed controlled substance database.  Sent brief supply of Norco as needed for severe pain.  Advised supportive care and RICE guidelines reviewed.  Advised to check on the referral to pain management as we cannot provide any further refills of any narcotic pain medication.  Continue with naproxen, gabapentin, Cymbalta, tizanidine.    Final Clinical Impressions(s) / UC Diagnoses   Final diagnoses:  Acute pain of right wrist  History of sarcoidosis     Discharge Instructions      -X-ray is normal. You need more work up of this condition. -Wear wrist brace and continue home meds. Hold off on Tylenol if you take the prescription narcotic pain meds.  -Check on referral to pain management. Any further pain meds will need to come from them if prescribed.      ED Prescriptions     Medication Sig Dispense Auth. Provider   HYDROcodone-acetaminophen (NORCO/VICODIN) 5-325 MG tablet Take 1 tablet by mouth every 6 (six) hours as needed for up to 3 days for severe pain. 10 tablet Shirlee Latch, PA-C      I have reviewed the PDMP during this encounter.   Shirlee Latch, PA-C 06/17/22 [phone]

## 2022-06-19 DIAGNOSIS — F411 Generalized anxiety disorder: Secondary | ICD-10-CM | POA: Diagnosis not present

## 2022-06-23 DIAGNOSIS — D860 Sarcoidosis of lung: Secondary | ICD-10-CM | POA: Diagnosis not present

## 2022-06-23 DIAGNOSIS — F411 Generalized anxiety disorder: Secondary | ICD-10-CM | POA: Diagnosis not present

## 2022-07-02 DIAGNOSIS — M2550 Pain in unspecified joint: Secondary | ICD-10-CM | POA: Diagnosis not present

## 2022-07-02 DIAGNOSIS — M67833 Other specified disorders of tendon, right wrist: Secondary | ICD-10-CM | POA: Diagnosis not present

## 2022-07-02 DIAGNOSIS — M7918 Myalgia, other site: Secondary | ICD-10-CM | POA: Diagnosis not present

## 2022-07-30 DIAGNOSIS — Z0100 Encounter for examination of eyes and vision without abnormal findings: Secondary | ICD-10-CM | POA: Diagnosis not present

## 2022-08-04 NOTE — Progress Notes (Signed)
Patient: Samantha [name]  Service Category: E/M  Provider: Oswaldo Done, MD  DOB: Dec 26, 1985  DOS: 08/11/2022  Referring Provider: Defoor, Allen Derry  MRN: [id number]  Setting: Ambulatory outpatient  PCP: Luciana Axe, NP  Type: New Patient  Specialty: Interventional Pain Management    Location: Office  Delivery: Face-to-face     Primary Reason(s) for Visit: Encounter for initial evaluation of one or more chronic problems (new to examiner) potentially causing chronic pain, and posing a threat to normal musculoskeletal function. (Level of risk: High) CC: Pain (Wrist, RIGHT is constant, arms, legs, chest)  HPI  Samantha [name] is a 37 y.o. year old, female patient, who comes for the first time to our practice referred by Defoor, Dionne Ano, PA-C for our initial evaluation of her chronic pain. 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 Sarcoidosis); Constant pain (right wrist, right foot, chest); Intermittent pain (upper extremities, shoulders, lower extremities); Night sweats; Muscle spasms of lower extremities (Bilateral); and Small fiber neuropathy (Sarcoidosis) on their problem list. Today she comes in for evaluation of her Pain (Wrist, RIGHT is  constant, arms, legs, chest)  Pain Assessment: Location: Right, Left (RIGHT is worse) Wrist (arms, legs, chest) Radiating: denies Onset: More than a month ago Duration: Chronic pain Quality: Aching, Dull, Spasm (wrist/ache; arms/sharp; legs/spasm) Severity: 3 /10 (subjective, self-reported pain score)  Effect on ADL: "sometimes requires breaks from work" Timing: Constant Modifying factors: remicaid infusions, tylenol, arm brace BP: 101/80  HR: 97  Onset and Duration: Gradual Cause of pain:  pulmonary sarcoidosis, neuropathy Severity: Getting worse, NAS-11 at its worse: 8/10, NAS-11 at its best: 3/10, NAS-11 now: 5/10, and NAS-11 on the average: 7/10 Timing: Not influenced by the time of the day, During activity or exercise, and After activity or exercise Aggravating Factors: Bending, Motion, Prolonged sitting, Walking uphill, and Working Alleviating Factors: Hot packs, Medications, TENS, and Using a brace Associated Problems: Constipation, Fatigue, Nausea, Numbness, Spasms, Sweating, Swelling, Tingling, and Weakness Quality of Pain: Aching, Annoying, Intermittent, Cramping, Dull, Itching, Pressure-like, Sharp, Shooting, Stabbing, Tingling, and Uncomfortable Previous Examinations or Tests: Biopsy, CT scan, MRI scan, Nerve conduction test, Neurological evaluation, and Psychiatric evaluation Previous Treatments: Narcotic medications, Physical Therapy, Steroid treatments by mouth, and TENS  Samantha [name] is being evaluated for possible interventional pain management therapies for the treatment of her chronic pain. (9 minutes late to appointment).  Comorbidities: The patient has a history of pulmonary sarcoidosis that has been treated by her rheumatologist (by Yvone Neu, MD - [facility] rheumatology) with Remicade infusions which seem to be very effective in controlling her chest pain.  According to the patient her primary area of pain is that of the right wrist.  This pain is described to be  constant.  She has already seen [facility] for this and she has already tried outpatient therapy which did not  really help.  She has been given a brace that does seem to help, but she is unable to use it at work since she works as a Research scientist (medical) and has difficulty doing her work with the brace.  She indicates having had x-rays which were found to be negative.  This pain is described to be constant and it seems to be associated with movement.  The patient's secondary area pain is that of the top of the right foot.  She denies having had any kind of x-rays of that area but she did have a nerve conduction study (by Theora Master, MD - [facility] neurology) of the lower extremities which was negative for large fiber disease.  (Typical neuropathy usually associated with sarcoidosis is small fiber neuropathy.)  This pain is also described to be constant but not associated to movement.  The patient's third area pain is out of the chest.  This particular pain is described to be intermittent and it initially came about as a consequence of nodules found in association with her pulmonary sarcoidosis.  Once she started the Remicade infusions the pain subsided and the nodules disappeared.  Recently she has started to again have these chest pains which she associates with the sarcoidosis.  However, this time she has been told that they could not see the nodules.  In all likelihood, it is possible that these are subclinical at this point.  She is pending another infusion of Remicade.  If pain goes away with treatment, it is very likely to be associated with this sarcoidosis.  The patient's fourth area pain seems to be that of diffuse, intermittent pain affecting the upper extremities and lower extremities.  These pains are described to be intermittent and associated with spasms.  The patient refers that she was sent over to [place] to evaluate for the possibility of fibromyalgia, which is usually diagnosed by rheumatology, using the  guidelines provided by the [facility] of rheumatology (ACR).  These guidelines are as follows: Generalized pain: Pain in at least four of five regions of the body, excluding the jaw, chest, and abdomen Widespread pain index (WPI): A score of 7 or higher, or a score between 4 and 6 along with a symptom severity scale (SSS) score of 9 or higher Symptom severity (SS) scale: A score of 5 or higher Symptoms present for at least 3 months: Symptoms must be present at a similar level for at least three months No other disorder to explain pain: The patient must not have another disorder that could explain the pain.  In summary, fibromyalgia is a diagnosis of exclusion.  Since this patient has a history of sarcoidosis, which is associated with chronic pain, it is unlikely that the diagnosis of fibromyalgia I would apply.  Pharmacotherapy: The patient indicates having tried oxycodone, tramadol, Cymbalta, gabapentin, naproxen, tizanidine, and their Remicade infusions which seem to be the most effective treatment for her.  The patient also indicated having been on oral prednisone for a while (3 to 4 months).  Samantha [name] has been informed that this initial visit was an evaluation only.  On the follow up appointment I will go over the results, including ordered tests and available interventional therapies. At that time she will have the opportunity to decide whether to proceed with offered therapies or not. In the event that Samantha [name] prefers avoiding interventional options, this will conclude our involvement in the case.  Medication management recommendations may be provided upon request.  Today I will be  ordering plain x-rays of her right foot and since her right wrist x-rays were negative but she continues to have this chronic pain, I will be ordering an MRI of the right wrist.  One possibility that may explain some of the patient's symptoms is that of "steroid myopathy".  Another possibility could be that  of muscle and joint aches associated with the use of Remicade.  Historic Controlled Substance Pharmacotherapy Review  PMP and historical list of controlled substances: Gabapentin 100 mg capsule, 1 cap p.o. 3 times daily (# 90) (last filled on 07/29/2022); dextroamphetamine/amphetamine ER 20 mg capsule, 1 cap p.o. daily (# 90) (last filled on 07/28/2022); tramadol 50 mg tablet, 1 tab p.o. 4 times daily (# 30) (last filled on 07/21/2022) Most recently prescribed opioid analgesics:   tramadol 50 mg tablet, 1 tab p.o. 4 times daily (# 30) (last filled on 07/21/2022) MME/day: 42.86 mg/day  Historical Monitoring: The patient  reports no history of drug use. List of prior UDS Testing: Lab Results  Component Value Date   MDMA POSITIVE 09/13/2013   MDMA NEGATIVE 08/11/2013   COCAINSCRNUR NEGATIVE 09/13/2013   COCAINSCRNUR NEGATIVE 08/11/2013   PCPSCRNUR NEGATIVE 09/13/2013   PCPSCRNUR NEGATIVE 08/11/2013   THCU NEGATIVE 09/13/2013   THCU NEGATIVE 08/11/2013   Historical Background Evaluation: [place] PMP: PDMP reviewed during this encounter. Review of the past 51-months conducted.             PMP NARX Score Report:  Narcotic: 320 Sedative: 200 Stimulant: 301  Department of public safety, offender search: Engineer, mining Information) Non-contributory Risk Assessment Profile: Aberrant behavior: None observed or detected today Risk factors for fatal opioid overdose: None identified today PMP NARX Overdose Risk Score: 410 Fatal overdose hazard ratio (HR): Calculation deferred Non-fatal overdose hazard ratio (HR): Calculation deferred Risk of opioid abuse or dependence: 0.7-3.0% with doses ? 36 MME/day and 6.1-26% with doses ? 120 MME/day. Substance use disorder (SUD) risk level: See below Personal History of Substance Abuse (SUD-Substance use disorder):  Alcohol: Negative  Illegal Drugs: Negative  Rx Drugs:    ORT Risk Level calculation: Low Risk  Opioid Risk Tool - 08/11/22 0924       Family History  of Substance Abuse   Alcohol Negative    Illegal Drugs Negative    Rx Drugs Negative      Personal History of Substance Abuse   Alcohol Negative    Illegal Drugs Negative      Age   Age between 16-45 years  No      History of Preadolescent Sexual Abuse   History of Preadolescent Sexual Abuse Negative or Female      Psychological Disease   Psychological Disease Negative    Depression Positive      Total Score   Opioid Risk Tool Scoring 1    Opioid Risk Interpretation Low Risk            ORT Scoring interpretation table:  Score <3 = Low Risk for SUD  Score between 4-7 = Moderate Risk for SUD  Score >8 = High Risk for Opioid Abuse    Pharmacologic Plan: As per protocol, I have not taken over any controlled substance management, pending the results of ordered tests and/or consults.            Initial impression: Pending review of available data and ordered tests.  Meds   Current Outpatient Medications:    amLODipine (NORVASC) 5 MG tablet, Take 5 mg by mouth daily., Disp: , Rfl:  budesonide-formoterol (SYMBICORT) 80-4.5 MCG/ACT inhaler, Inhale into the lungs., Disp: , Rfl:    DULoxetine (CYMBALTA) 60 MG capsule, Take 1 tablet by mouth daily., Disp: , Rfl:    gabapentin (NEURONTIN) 100 MG capsule, TAKE 1 TO 2 CAPSULES BY MOUTH NIGHTLY AND CAN TAKE 1 ADDITIONAL CAPSULE NIGHTLY AS NEEDED FOR PAIN, Disp: , Rfl:    hydrochlorothiazide (HYDRODIURIL) 12.5 MG tablet, Take 12.5 mg by mouth daily., Disp: , Rfl:    losartan (COZAAR) 25 MG tablet, Take 25 mg by mouth daily., Disp: , Rfl:    MedroxyPROGESTERone Acetate 150 MG/ML SUSY, Inject 1 mL (150 mg total) as directed once. (Patient taking differently: Inject 150 mg as directed every 3 (three) months.), Disp: 1 Syringe, Rfl: 3   metoprolol succinate (TOPROL-XL) 50 MG 24 hr tablet, Take 1 tablet by mouth daily., Disp: , Rfl:    naproxen (NAPROSYN) 500 MG tablet, Take 500 mg by mouth 2 (two) times daily., Disp: , Rfl:     nortriptyline (PAMELOR) 10 MG capsule, Take 1 capsule by mouth every morning., Disp: , Rfl:    nortriptyline (PAMELOR) 50 MG capsule, Take 50 mg by mouth at bedtime., Disp: , Rfl:    omeprazole (PRILOSEC) 20 MG capsule, Take by mouth., Disp: , Rfl:    tiZANidine (ZANAFLEX) 2 MG tablet, TAKE 1 TABLET (2 MG TOTAL) BY MOUTH 3 (THREE) TIMES DAILY AS NEEDED FOR UP TO 90 DAYS, Disp: , Rfl:    traZODone (DESYREL) 100 MG tablet, Take 100 mg by mouth at bedtime., Disp: , Rfl:    WEGOVY 0.5 MG/0.5ML SOAJ, Inject into the skin., Disp: , Rfl:   Imaging Review  Wrist Imaging: Wrist-R DG Complete: Results for orders placed during the hospital encounter of 06/17/22 DG Wrist Complete Right  Narrative CLINICAL DATA:  Pain for 2-3 months without trauma  EXAM: RIGHT WRIST - COMPLETE 3+ VIEW  COMPARISON:  None Available.  FINDINGS: No acute fracture or dislocation. Joint spaces maintained. Scaphoid intact.  IMPRESSION: No acute osseous abnormality.   Electronically Signed By: Jeronimo Greaves M.D. On: 06/17/2022 17:12  Complexity Note: Imaging results reviewed.                         ROS  Cardiovascular: High blood pressure and Chest pain Pulmonary or Respiratory: Lung problems, Shortness of breath, Sarcoidosis, and Temporary stoppage of breathing during sleep Neurological: No reported neurological signs or symptoms such as seizures, abnormal skin sensations, urinary and/or fecal incontinence, being born with an abnormal open spine and/or a tethered spinal cord Psychological-Psychiatric: Anxiousness, Depressed, and Attempted suicide Gastrointestinal: Reflux or heatburn and Irregular, infrequent bowel movements (Constipation) Genitourinary: No reported renal or genitourinary signs or symptoms such as difficulty voiding or producing urine, peeing blood, non-functioning kidney, kidney stones, difficulty emptying the bladder, difficulty controlling the flow of urine, or chronic kidney  disease Hematological: Brusing easily and Bleeding easily Endocrine: No reported endocrine signs or symptoms such as high or low blood sugar, rapid heart rate due to high thyroid levels, obesity or weight gain due to slow thyroid or thyroid disease Rheumatologic: Constant unexplained fatigue (Chronic Fatigue Syndrome) Musculoskeletal: Negative for myasthenia gravis, muscular dystrophy, multiple sclerosis or malignant hyperthermia Work History: Working full time  Allergies  Samantha [name] has No Known Allergies.  Laboratory Chemistry Profile   Renal Lab Results  Component Value Date   BUN 12 04/05/2022   CREATININE 0.95 04/05/2022   GFRAA >60 03/19/2016   GFRNONAA >60 04/05/2022  PROTEINUR NEGATIVE 04/12/2022     Electrolytes Lab Results  Component Value Date   NA 138 04/05/2022   K 3.3 (L) 04/05/2022   CL 104 04/05/2022   CALCIUM 9.4 04/05/2022     Hepatic Lab Results  Component Value Date   AST 21 08/22/2020   ALT 15 08/22/2020   ALBUMIN 4.3 08/22/2020   ALKPHOS 61 08/22/2020     ID Lab Results  Component Value Date   HIV Non Reactive 08/22/2020   SARSCOV2NAA NEGATIVE 12/26/2021   PREGTESTUR NEGATIVE 04/12/2022     Bone No results found for: "VD25OH", "VD125OH2TOT", "WU9811BJ4", "NW2956OZ3", "25OHVITD1", "25OHVITD2", "25OHVITD3", "TESTOFREE", "TESTOSTERONE"   Endocrine Lab Results  Component Value Date   GLUCOSE 75 04/05/2022   GLUCOSEU NEGATIVE 04/12/2022     Neuropathy Lab Results  Component Value Date   HIV Non Reactive 08/22/2020     CNS No results found for: "COLORCSF", "APPEARCSF", "RBCCOUNTCSF", "WBCCSF", "POLYSCSF", "LYMPHSCSF", "EOSCSF", "PROTEINCSF", "GLUCCSF", "JCVIRUS", "CSFOLI", "IGGCSF", "LABACHR", "ACETBL"   Inflammation (CRP: Acute  ESR: Chronic) No results found for: "CRP", "ESRSEDRATE", "LATICACIDVEN"   Rheumatology No results found for: "RF", "ANA", "LABURIC", "URICUR", "LYMEIGGIGMAB", "LYMEABIGMQN", "HLAB27"   Coagulation Lab  Results  Component Value Date   INR 0.9 09/26/2020   LABPROT 12.6 09/26/2020   APTT 30 09/26/2020   PLT 414 (H) 04/05/2022   DDIMER 0.31 04/05/2022     Cardiovascular Lab Results  Component Value Date   HGB 14.4 04/05/2022   HCT 42.7 04/05/2022     Screening Lab Results  Component Value Date   SARSCOV2NAA NEGATIVE 12/26/2021   HIV Non Reactive 08/22/2020   PREGTESTUR NEGATIVE 04/12/2022     Cancer No results found for: "CEA", "CA125", "LABCA2"   Allergens No results found for: "ALMOND", "APPLE", "ASPARAGUS", "AVOCADO", "BANANA", "BARLEY", "BASIL", "BAYLEAF", "GREENBEAN", "LIMABEAN", "WHITEBEAN", "BEEFIGE", "REDBEET", "BLUEBERRY", "BROCCOLI", "CABBAGE", "MELON", "CARROT", "CASEIN", "CASHEWNUT", "CAULIFLOWER", "CELERY"     Note: Lab results reviewed.  PFSH  Drug: Samantha [name]  reports no history of drug use. Alcohol:  reports current alcohol use. Tobacco:  reports that she quit smoking about 4 years ago. Her smoking use included cigarettes. She smoked an average of .5 packs per day. She has never used smokeless tobacco. Medical:  has a past medical history of Anxiety, Depression, Hypertension, and Pre-diabetes. Family: family history includes Healthy in her mother.  Past Surgical History:  Procedure Laterality Date   NO PAST SURGERIES     VIDEO BRONCHOSCOPY WITH ENDOBRONCHIAL ULTRASOUND N/A 09/28/2020   Procedure: VIDEO BRONCHOSCOPY WITH ENDOBRONCHIAL ULTRASOUND;  Surgeon: Vida Rigger, MD;  Location: [facility] ORS;  Service: Thoracic;  Laterality: N/A;   Active Ambulatory Problems    Diagnosis Date Noted   Anxiety 09/23/2017   Chest mass 07/04/2020   Depression 12/21/2019   Essential hypertension 12/30/2019   Hemorrhoids 12/21/2019   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   Prediabetes 12/22/2019   Migraines 12/21/2019   Tension type headache 09/02/2017   Well adult exam 09/02/2017   Chronic nausea 01/07/2021   Class 2 drug-induced obesity without serious  comorbidity with body mass index (BMI) of 36.0 to 36.9 in adult 10/08/2021   Dry eye syndrome, bilateral 01/07/2021   Dry mouth 01/07/2021   Muscle spasm 11/15/2020   Numbness and tingling 11/15/2020   OSA on CPAP 01/17/2022   Other fatigue 01/17/2022   Sarcoidosis of lung (HCC) 01/07/2021   Seasonal allergic rhinitis due to pollen 05/07/2022   Vitamin D deficiency 07/09/2021   Chronic pain syndrome 08/07/2022  Pharmacologic therapy 08/07/2022   Disorder of skeletal system 08/07/2022   Problems influencing health status 08/07/2022   Chronic use of opiate for therapeutic purpose 08/07/2022   Sarcoidosis 08/11/2022   Chronic wrist pain (1ry area of Pain) (Right) 08/11/2022   Chronic foot pain and (2ry area of Pain) (Right) 08/11/2022   Chronic chest pain (3ry area of Pain) (w/ hx. Sarcoidosis) 08/11/2022   Constant pain (right wrist, right foot, chest) 08/11/2022   Intermittent pain (upper extremities, shoulders, lower extremities) 08/11/2022   Night sweats 08/11/2022   Muscle spasms of lower extremities (Bilateral) 08/11/2022   Small fiber neuropathy (Sarcoidosis) 08/11/2022   Resolved Ambulatory Problems    Diagnosis Date Noted   No Resolved Ambulatory Problems   Past Medical History:  Diagnosis Date   Hypertension    Pre-diabetes    Constitutional Exam  General appearance: Well nourished, well developed, and well hydrated. In no apparent acute distress Vitals:   08/11/22 0924  BP: 101/80  Pulse: 97  Resp: 17  Temp: (!) 97.4 F (36.3 C)  TempSrc: Temporal  SpO2: 99%  Weight: 218 lb 12.8 oz (99.2 kg)  Height: 5\' 4"  (1.626 m)   BMI Assessment: Estimated body mass index is 37.56 kg/m as calculated from the following:   Height as of this encounter: 5\' 4"  (1.626 m).   Weight as of this encounter: 218 lb 12.8 oz (99.2 kg).  BMI interpretation table: BMI level Category Range association with higher incidence of chronic pain  <18 kg/m2 Underweight   18.5-24.9 kg/m2  Ideal body weight   25-29.9 kg/m2 Overweight Increased incidence by 20%  30-34.9 kg/m2 Obese (Class I) Increased incidence by 68%  35-39.9 kg/m2 Severe obesity (Class II) Increased incidence by 136%  >40 kg/m2 Extreme obesity (Class III) Increased incidence by 254%   Patient's current BMI Ideal Body weight  Body mass index is 37.56 kg/m. Ideal body weight: 54.7 kg (120 lb 9.5 oz) Adjusted ideal body weight: 72.5 kg (159 lb 14 oz)   BMI Readings from Last 4 Encounters:  08/11/22 37.56 kg/m  06/17/22 34.11 kg/m  04/12/22 34.61 kg/m  04/05/22 35.26 kg/m   Wt Readings from Last 4 Encounters:  08/11/22 218 lb 12.8 oz (99.2 kg)  06/17/22 205 lb (93 kg)  04/12/22 208 lb (94.3 kg)  04/05/22 211 lb 13.8 oz (96.1 kg)    Psych/Mental status: Alert, oriented x 3 (person, place, & time)       Eyes: PERLA Respiratory: No evidence of acute respiratory distress  Assessment  Primary Diagnosis & Pertinent Problem List: The primary encounter diagnosis was Chronic wrist pain (1ry area of Pain) (Right). Diagnoses of Chronic foot pain and (2ry area of Pain) (Right), Chronic chest pain (3ry area of Pain) (w/ hx. Sarcoidosis), Muscle spasms of lower extremities (Bilateral), Sarcoidosis, Constant pain (right wrist, right foot, chest), Intermittent pain (upper extremities, shoulders, lower extremities), Night sweats, Small fiber neuropathy (Sarcoidosis), Chronic pain syndrome, Pharmacologic therapy, Chronic use of opiate for therapeutic purpose, Disorder of skeletal system, and Problems influencing health status were also pertinent to this visit.  Visit Diagnosis (New problems to examiner): 1. Chronic wrist pain (1ry area of Pain) (Right)   2. Chronic foot pain and (2ry area of Pain) (Right)   3. Chronic chest pain (3ry area of Pain) (w/ hx. Sarcoidosis)   4. Muscle spasms of lower extremities (Bilateral)   5. Sarcoidosis   6. Constant pain (right wrist, right foot, chest)   7. Intermittent pain  (upper extremities,  shoulders, lower extremities)   8. Night sweats   9. Small fiber neuropathy (Sarcoidosis)   10. Chronic pain syndrome   11. Pharmacologic therapy   12. Chronic use of opiate for therapeutic purpose   13. Disorder of skeletal system   14. Problems influencing health status    Plan of Care (Initial workup plan)  Note: Samantha [name] was reminded that as per protocol, today's visit has been an evaluation only. We have not taken over the patient's controlled substance management.  Problem-specific plan: No problem-specific Assessment & Plan notes found for this encounter.  Lab Orders         Compliance Drug Analysis, Ur         Comp. Metabolic Panel (12)         Magnesium         Vitamin B12         Sedimentation rate         25-Hydroxy vitamin D Lcms D2+D3         C-reactive protein     Imaging Orders         MR WRIST RIGHT WO CONTRAST         DG Foot Complete Right     Referral Orders  No referral(s) requested today   Procedure Orders    No procedure(s) ordered today   Pharmacotherapy (current): Medications ordered:  No orders of the defined types were placed in this encounter.  Medications administered during this visit: Samantha [name] had no medications administered during this visit.   Analgesic Pharmacotherapy:  Opioid Analgesics: For patients currently taking or requesting to take opioid analgesics, in accordance with [facility] Guidelines, we will assess their risks and indications for the use of these substances. After completing our evaluation, we may offer recommendations, but we no longer take patients for medication management. The prescribing physician will ultimately decide, based on his/her training and level of comfort whether to adopt any of the recommendations, including whether or not to prescribe such medicines.  Membrane stabilizer: To be determined at a later time  Muscle relaxant: To be determined at a later time   NSAID: To be determined at a later time  Other analgesic(s): To be determined at a later time   Interventional management options: Samantha [name] was informed that there is no guarantee that she would be a candidate for interventional therapies. The decision will be based on the results of diagnostic studies, as well as Samantha [name]'s risk profile.  Procedure(s) under consideration:  Pending results of ordered studies      Interventional Therapies  Risk Factors  Considerations  Medical Comorbidities:     Planned  Pending:      Under consideration:   Pending   Completed:   None at this time   Therapeutic  Palliative (PRN) options:   None established   Completed by other providers:   Diagnostic EMG/PNCV (LE) (05/29/2021) by Theora Master, MD [facility] neurology) Dx: WNL      Provider-requested follow-up: Return in about 2 weeks (around 08/25/2022) for ( ), Eval-day (M,W), (F2F), 2nd Visit, for review of ordered tests.  Future Appointments  Date Time Provider Department Center  09/10/2022 11:00 AM Delano Metz, MD [facility] None     Duration of encounter: 68 minutes.  Total time on encounter, as per AMA guidelines included both the face-to-face and non-face-to-face time personally spent by the physician and/or other qualified health care professional(s) on the day of the encounter (includes time  in activities that require the physician or other qualified health care professional and does not include time in activities normally performed by clinical staff). Physician's time may include the following activities when performed: Preparing to see the patient (e.g., pre-charting review of records, searching for previously ordered imaging, lab work, and nerve conduction tests) Review of prior analgesic pharmacotherapies. Reviewing PMP Interpreting ordered tests (e.g., lab work, imaging, nerve conduction tests) Performing post-procedure evaluations, including interpretation of  diagnostic procedures Obtaining and/or reviewing separately obtained history Performing a medically appropriate examination and/or evaluation Counseling and educating the patient/family/caregiver Ordering medications, tests, or procedures Referring and communicating with other health care professionals (when not separately reported) Documenting clinical information in the electronic or other health record Independently interpreting results (not separately reported) and communicating results to the patient/ family/caregiver Care coordination (not separately reported)  Note by: Oswaldo Done, MD (TTS technology used. I apologize for any typographical errors that were not detected and corrected.) Date: 08/11/2022; Time: 10:57 AM

## 2022-08-07 DIAGNOSIS — Z789 Other specified health status: Secondary | ICD-10-CM | POA: Insufficient documentation

## 2022-08-07 DIAGNOSIS — G894 Chronic pain syndrome: Secondary | ICD-10-CM | POA: Insufficient documentation

## 2022-08-07 DIAGNOSIS — Z79891 Long term (current) use of opiate analgesic: Secondary | ICD-10-CM | POA: Insufficient documentation

## 2022-08-07 DIAGNOSIS — Z79899 Other long term (current) drug therapy: Secondary | ICD-10-CM | POA: Insufficient documentation

## 2022-08-07 DIAGNOSIS — M899 Disorder of bone, unspecified: Secondary | ICD-10-CM | POA: Insufficient documentation

## 2022-08-07 NOTE — Patient Instructions (Addendum)
You will have your MRI completed prior to next appt. With pain clinic. ____________________________________________________________________________________________  New Patients  Welcome to [facility].   Initial Visit The first or initial visit consists of an evaluation only.   Interventional pain management.  We offer therapies other than opioid controlled substances to manage chronic pain. These include, but are not limited to, diagnostic, therapeutic, and palliative specialized injection therapies (i.e.: Epidural Steroids, Facet Blocks, etc.). We specialize in a variety of nerve blocks as well as radiofrequency treatments. We offer pain implant evaluations and trials, as well as follow up management. In addition we also provide a variety joint injections, including Viscosupplementation (AKA: Gel Therapy).  Prescription Pain Medication. We specialize in alternatives to opioids. We can provide evaluations and recommendations for/of pharmacologic therapies based on CDC Guidelines.  We no longer take patients for long-term medication management. We will not be taking over your pain medications.  ____________________________________________________________________________________________    ____________________________________________________________________________________________  Patient Information update  To: All of our patients.  Re: Name change.  It has been made official that our current name, "[facility]"   will soon be changed to "[facility]".   The purpose of this change is to eliminate any confusion created by the concept of our practice being a "Medication Management Pain Clinic". In the past this has led to the misconception that we treat pain primarily by the use of prescription medications.  Nothing  can be farther from the truth.   Understanding PAIN MANAGEMENT: To further understand what our practice does, you first have to understand that "Pain Management" is a subspecialty that requires additional training once a physician has completed their specialty training, which can be in either Anesthesia, Neurology, Psychiatry, or Physical Medicine and Rehabilitation (PMR). Each one of these contributes to the final approach taken by each physician to the management of their patient's pain. To be a "Pain Management Specialist" you must have first completed one of the specialty trainings below.  Anesthesiologists - trained in clinical pharmacology and interventional techniques such as nerve blockade and regional as well as central neuroanatomy. They are trained to block pain before, during, and after surgical interventions.  Neurologists - trained in the diagnosis and pharmacological treatment of complex neurological conditions, such as Multiple Sclerosis, Parkinson's, spinal cord injuries, and other systemic conditions that may be associated with symptoms that may include but are not limited to pain. They tend to rely primarily on the treatment of chronic pain using prescription medications.  Psychiatrist - trained in conditions affecting the psychosocial wellbeing of patients including but not limited to depression, anxiety, schizophrenia, personality disorders, addiction, and other substance use disorders that may be associated with chronic pain. They tend to rely primarily on the treatment of chronic pain using prescription medications.   Physical Medicine and Rehabilitation (PMR) physicians, also known as physiatrists - trained to treat a wide variety of medical conditions affecting the brain, spinal cord, nerves, bones, joints, ligaments, muscles, and tendons. Their training is primarily aimed at treating patients that have suffered injuries that have caused severe physical impairment. Their training  is primarily aimed at the physical therapy and rehabilitation of those patients. They may also work alongside orthopedic surgeons or neurosurgeons using their expertise in assisting surgical patients to recover after their surgeries.  INTERVENTIONAL PAIN MANAGEMENT is sub-subspecialty of Pain Management.  Our physicians are Board-certified in Anesthesia, Pain Management, and  Interventional Pain Management.  This meaning that not only have they been trained and Board-certified in their specialty of Anesthesia, and subspecialty of Pain Management, but they have also received further training in the sub-subspecialty of Interventional Pain Management, in order to become Board-certified as INTERVENTIONAL PAIN MANAGEMENT SPECIALIST.    Mission: Our goal is to use our skills in  INTERVENTIONAL PAIN MANAGEMENT as alternatives to the chronic use of prescription opioid medications for the treatment of pain. To make this more clear, we have changed our name to reflect what we do and offer. We will continue to offer medication management assessment and recommendations, but we will not be taking over any patient's medication management.  ____________________________________________________________________________________________    ____________________________________________________________________________________________  Muscle Spasms & Cramps  Cause(s):  Most common - vitamin and/or electrolyte (calcium, potassium, sodium, etc.) deficiencies. Post procedure - steroids can make your kidneys excrete electrolytes. If you happen to have been borderline low on your electrolytes, it may temporarily triggering cramps & spasms.  Possible triggers: Sweating - causes loss of electrolytes thru the skin. Steroids - causes loss of electrolytes thru the urine.  Treatment: Gatorade (or any other electrolyte-replenishing drink) - Take 1, 8 oz glass with each meal (3 times a day). OTC (over-the-counter) Magnesium 400  to 500 mg - Take 1 tablet twice a day (one with breakfast and one before bedtime). If you have kidney problems, talk to your primary care physician before taking any Magnesium. Tonic Water with quinine - Take 1, 8 oz glass before bedtime.   ____________________________________________________________________________________________  TENS (Device can be purchased "online", without prescription. Search: "TENS 7000".) Transcutaneous electrical nerve stimulation (TENS) is a method of pain relief involving the use of a mild electrical current. A TENS machine is a small, battery-operated device that has leads connected to sticky pads called electrodes.   Rechargeable 9V batteries:     Electrode placement:   TENS UNIT SAFETY WARNING SHEET and INFORMATION INDICATIONS AND CONTRAINDICTIONS Read the operation manual before using the device. Freight forwarder ([place]) restricts this device to sale by or on the order of a physician. Observe your physician's precise instructions and let him show you where to apply the electrodes. For a successful therapy, the correct application of the electrodes is an important factor. Carefully write down the settings your physician recommended. Indications for use This device is a prescription device and only for symptomatic relief of chronic intractable pain. Contraindications:   Any electrode placement that applies current to the carotid sinus (neck) region.   Patients with implanted electronic devices (for example, a pacemaker) or metallic implants should not undertake.   Any electrode placement that causes current to flow transcerebrally (through the head). The use of unit whenever pain symptoms are undiagnosed, unit etiology is determined.   The use of TENS whenever pain syndromes are undiagnosed, until etiology is established.  WARNINGS AND PRECAUTIONS  Warnings:   The device must be kept out of reach of children.   The safety of device for use during pregnancy or delivery has  not been established.   Do not place electrodes on front of the throat. This may result in spasms of the laryngeal and pharyngeal muscles.   Do not place the electrodes over the carotid nerve (side of neck below ear).   The device is not effective for pain of central origin (headaches).   The device may interfere with electronic monitoring equipment (such as ECG monitors and ECG alarms).   Electrodes should not be placed over  the eyes, in the mouth, or internally.   These devices have no curative value.   TENS devices should be used only under the continued supervision of a physician.   TENS is a symptomatic treatment and as such suppresses the sensation of pain which would otherwise serve as a protective mechanism. Precautions/Adverse Reactions   Isolated cases of skin irritation may occur at the site of electrode placement following long-term application.   Stimulation should be stopped and electrodes removed until the cause of the irritation can be determined.   Effectiveness is highly dependent upon patient selection by a person qualified in the management of pain patients.   If the device treatment becomes ineffective or unpleasant, stimulation should be discontinued until reevaluation by a physician/clinician.   Always turn the device off before applying or removing electrodes.   Skin irritation and electrode burns are potential adverse reactions.  PURPOSE: A Transcutaneous Electrical Nerve Stimulator, or TENS, unit is designed to relieve post-operative, acute and chronic pain. It is used for pain caused by peripheral nerves and not central. TENS units are prescription-only devices.  OPERATION: TENS units work in a couple of ways. The first way they are thought to work is by a method called the [employer]. The [employer] states that our brains can only handle one stimulus at a time. When you have chronic pain, this pain signal is constantly being sent to your brain and recognized as pain. When an  electrical stimulus is added to the area of pain the body feels this electrical stimulus, and since the brain can only handle one thing at a time, the pain is not transmitted to the brain. The second method thought to be part of TENS unit's success is by way of stimulating our own bodies to release their own natural painkillers. TENS units do not work for everyone and results may vary. Always follow the instructions and warnings in your user's manual.  USE: One of the most important tasks that must be performed is battery maintenance. If you are using a Engineering geologist, always fully charge it and fully deplete it before charging it again. These batteries can develop memories and by not performing this charging task correctly, your battery's life can be greatly diminished. If your battery does develop a memory you can help expand the memory by charging for 12 - 13 hours and then completely depleting the battery. Always prepare the skin before applying electrodes. Your skin should be clean and free of any lotions or creams. If you are using electrodes that use conductive gel, apply a small, even layer over the electrode. For carbon, self-adhesive electrodes, apply a drop of water to the electrodes before applying to the skin. The electrodes attach to the lead wires and then the TENS unit. Always grasp the connector and not the cord when inserting or removing. When making adjustments, always make sure the unit's channels (1 and 2) are in the OFF position. The actual settings should be recommended and prescribed by your physician. Medical equipment suppliers don'tset or instruct users as to user settings. When you are using the BURST mode, the unit delivers a series of quick pulses followed by a rest. This cycle repeats itself frequently. Always have channels OFF before changing modes.  For MODULATION mode, the stimulation automatically varies the width of the pulse.  For CONVENTIONAL mode, the  stimulation is constant. After the settings have been fine-tuned, set the timer to 30 or 60 minutes. Your physician should also  prescribe the use time. When the lights become dim, it means your batteries should be replaced or recharged.  ACCESSORIES: The electrodes and lead wires can be obtained from your medical equipment supplier. Your medical equipment supplier can set up a recurring delivery to accommodate your needs. Electrodes should be replaced once a month and lead wires once every 6 months.  Video Tutorial https://youtu.be/V_quvXRrlQE?[email or web address]

## 2022-08-11 ENCOUNTER — Encounter: Payer: Self-pay | Admitting: Pain Medicine

## 2022-08-11 ENCOUNTER — Ambulatory Visit (HOSPITAL_BASED_OUTPATIENT_CLINIC_OR_DEPARTMENT_OTHER): Payer: 59 | Admitting: Pain Medicine

## 2022-08-11 ENCOUNTER — Ambulatory Visit
Admission: RE | Admit: 2022-08-11 | Discharge: 2022-08-11 | Disposition: A | Discharge status: Home / Self Care | Payer: 59 | Attending: Pain Medicine | Admitting: Pain Medicine

## 2022-08-11 ENCOUNTER — Ambulatory Visit
Admission: RE | Admit: 2022-08-11 | Discharge: 2022-08-11 | Disposition: A | Discharge status: Home / Self Care | Payer: 59 | Source: Ambulatory Visit | Attending: Pain Medicine | Admitting: Pain Medicine

## 2022-08-11 VITALS — BP 101/80 | HR 97 | Temp 97.4°F | Resp 17 | Ht 64.0 in | Wt 218.8 lb

## 2022-08-11 DIAGNOSIS — M62838 Other muscle spasm: Secondary | ICD-10-CM | POA: Insufficient documentation

## 2022-08-11 DIAGNOSIS — M25531 Pain in right wrist: Secondary | ICD-10-CM

## 2022-08-11 DIAGNOSIS — G894 Chronic pain syndrome: Secondary | ICD-10-CM

## 2022-08-11 DIAGNOSIS — R079 Chest pain, unspecified: Secondary | ICD-10-CM | POA: Insufficient documentation

## 2022-08-11 DIAGNOSIS — Z789 Other specified health status: Secondary | ICD-10-CM

## 2022-08-11 DIAGNOSIS — G8929 Other chronic pain: Secondary | ICD-10-CM

## 2022-08-11 DIAGNOSIS — R52 Pain, unspecified: Secondary | ICD-10-CM

## 2022-08-11 DIAGNOSIS — Z79891 Long term (current) use of opiate analgesic: Secondary | ICD-10-CM | POA: Insufficient documentation

## 2022-08-11 DIAGNOSIS — R61 Generalized hyperhidrosis: Secondary | ICD-10-CM

## 2022-08-11 DIAGNOSIS — Z79899 Other long term (current) drug therapy: Secondary | ICD-10-CM

## 2022-08-11 DIAGNOSIS — M79671 Pain in right foot: Secondary | ICD-10-CM

## 2022-08-11 DIAGNOSIS — G629 Polyneuropathy, unspecified: Secondary | ICD-10-CM

## 2022-08-11 DIAGNOSIS — M899 Disorder of bone, unspecified: Secondary | ICD-10-CM | POA: Insufficient documentation

## 2022-08-11 DIAGNOSIS — D869 Sarcoidosis, unspecified: Secondary | ICD-10-CM

## 2022-08-11 NOTE — Progress Notes (Signed)
Safety precautions to be maintained throughout the outpatient stay will include: orient to surroundings, keep bed in low position, maintain call bell within reach at all times, provide assistance with transfer out of bed and ambulation.  

## 2022-08-12 DIAGNOSIS — G4733 Obstructive sleep apnea (adult) (pediatric): Secondary | ICD-10-CM | POA: Diagnosis not present

## 2022-08-12 LAB — SEDIMENTATION RATE: Sed Rate: 4 mm/hr (ref 0–32)

## 2022-08-12 LAB — MAGNESIUM: Magnesium: 2 mg/dL (ref 1.6–2.3)

## 2022-08-12 LAB — C-REACTIVE PROTEIN: CRP: <1 mg/L (ref 0–10)

## 2022-08-12 LAB — COMP. METABOLIC PANEL (12)
AST: 17 IU/L (ref 0–40)
Alkaline Phosphatase: 72 IU/L (ref 44–121)
eGFR: 93 mL/min/{1.73_m2} (ref >59)

## 2022-08-12 LAB — 25-HYDROXY VITAMIN D LCMS D2+D3

## 2022-08-12 IMAGING — MR MR HEAD W/O CM
13 series · 48 of 48 positions shown · non-contrast
Comparison: Head CT 12/18/2008.

CLINICAL DATA: Provided history: Numbness and tingling. Muscle
spasm. Shaking. Additional history provided by scanning
technologist: Patient reports right arm/leg numbness (worse in right
foot). Patient also reports pressure in neck and left ear.

EXAM:
MRI HEAD WITHOUT CONTRAST
TECHNIQUE: Multiplanar, multiecho pulse sequences of the brain and surrounding
structures were obtained without intravenous contrast.

[Series 5: ax dwi_tracew · axial · 3.0mm · 0.65mm/px · z∈[-140,+15]mm · 3 of 48 slices shown]
[im 1/48]
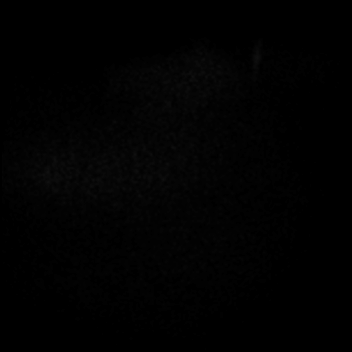
[im 24/48]
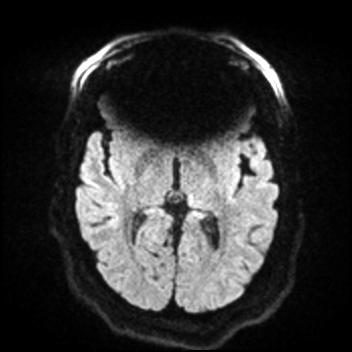
[im 48/48]
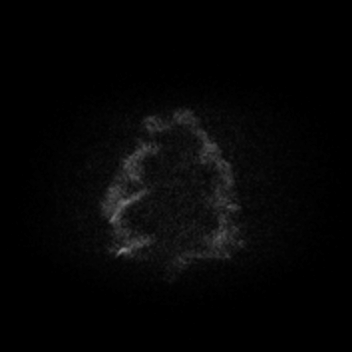

[Series 6: ax dwi_adc · axial · 3.0mm · 0.65mm/px · z∈[-136,+15]mm · 3 of 45 slices shown]
[im 1/45]
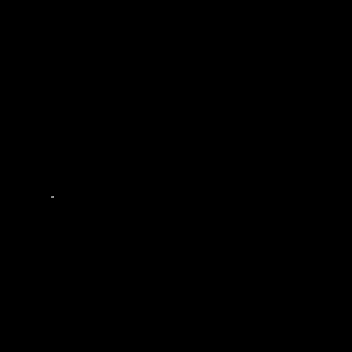
[im 23/45]
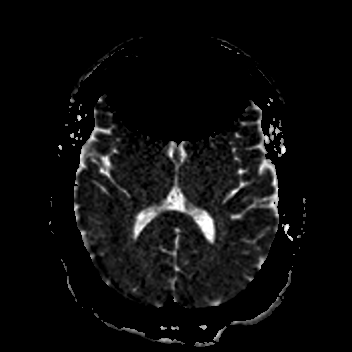
[im 45/45]
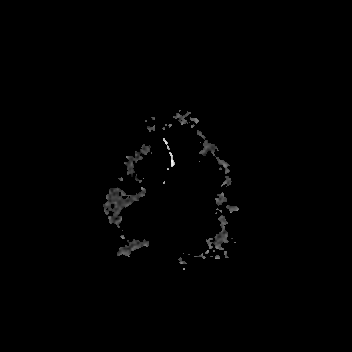

[Series 7: cor dwi_tracew · coronal · 5.0mm · 0.68mm/px · 3 of 40 slices shown]
[im 1/40]
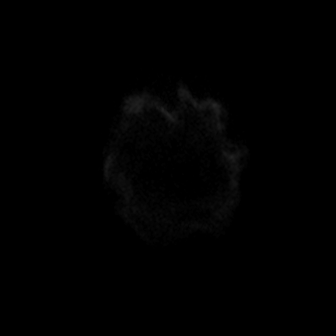
[im 20/40]
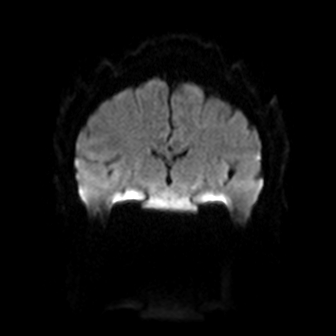
[im 40/40]
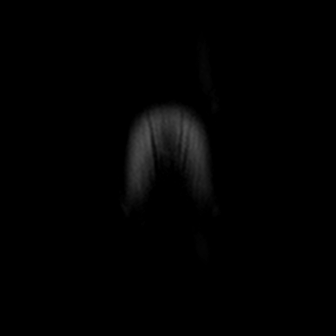

[Series 8: cor dwi_adc · coronal · 5.0mm · 0.68mm/px · 3 of 40 slices shown]
[im 1/40]
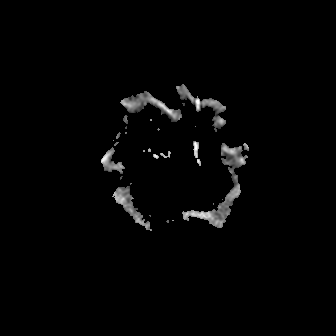
[im 20/40]
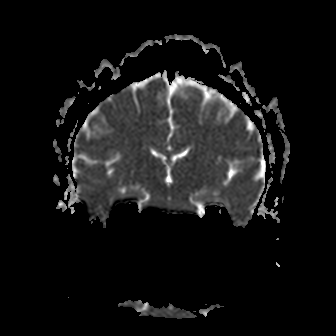
[im 40/40]
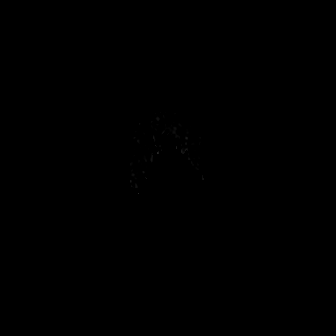

[Series 9: T1 · sagittal · 5.0mm · 0.62mm/px · 2 of 25 slices shown (1 of 2)]
[im 1/25]
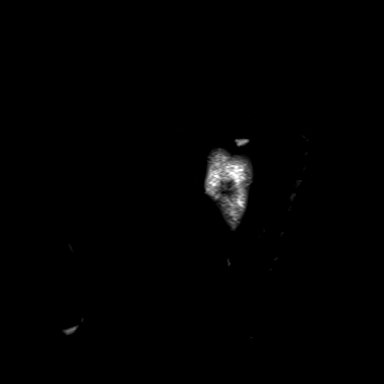
[im 25/25]
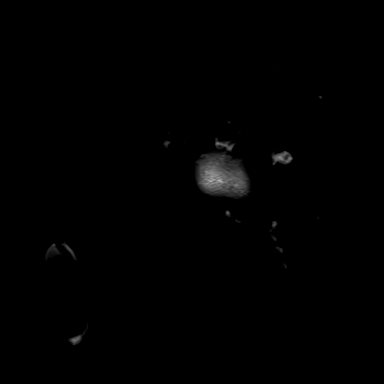

[Series 10: T2 · axial · 5.0mm · 0.53mm/px · z∈[-130,+14]mm · 2 of 25 slices shown (1 of 2)]
[im 1/25]
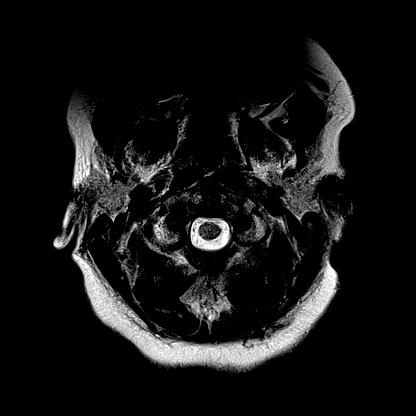
[im 25/25]
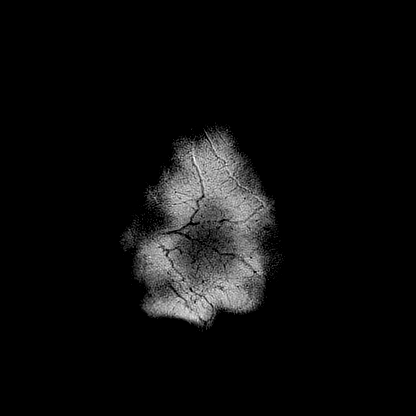

[Series 11: mag_images · axial · 3.0mm · 0.90mm/px · z∈[-146,+30]mm · 4 of 60 slices shown]
[im 1/60]
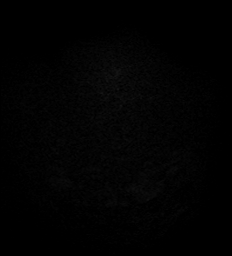
[im 20/60]
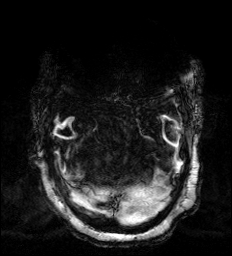
[im 40/60]
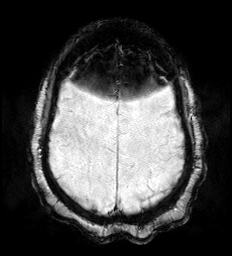
[im 60/60]
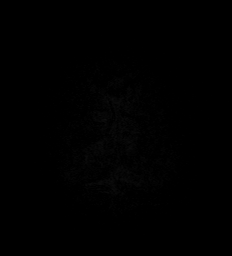

[Series 12: pha_images · axial · 3.0mm · 0.90mm/px · z∈[-143,+30]mm · 4 of 59 slices shown]
[im 1/59]
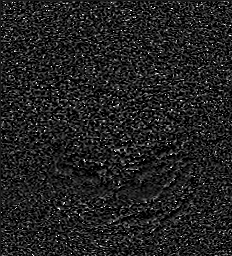
[im 20/59]
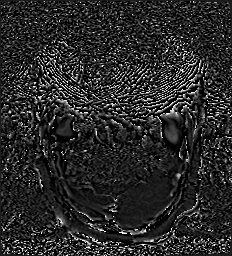
[im 39/59]
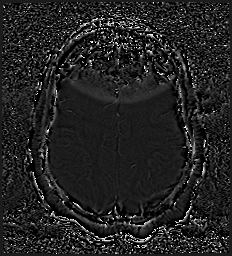
[im 59/59]
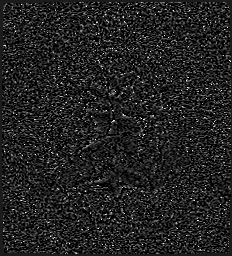

[Series 13: swi_images · axial · 3.0mm · 0.90mm/px · z∈[-146,+30]mm · 4 of 60 slices shown]
[im 1/60]
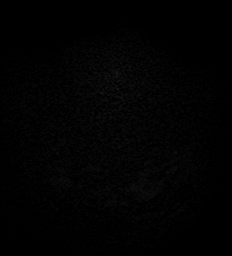
[im 20/60]
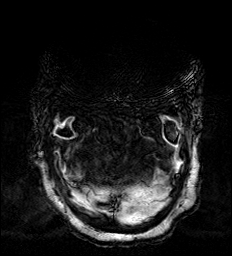
[im 40/60]
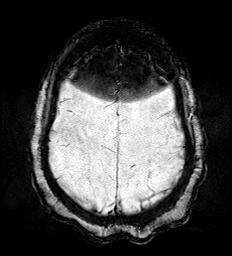
[im 60/60]
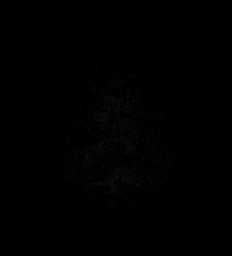

[Series 15: FLAIR · axial · 3.0mm · 0.53mm/px · z∈[-137,+24]mm · 4 of 55 slices shown (1 of 2)]
[im 1/55]
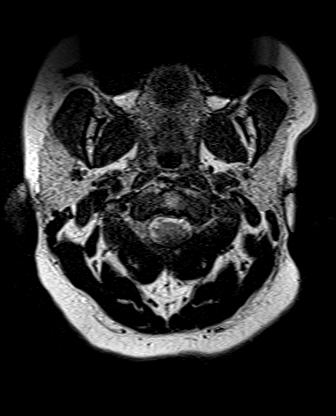
[im 19/55]
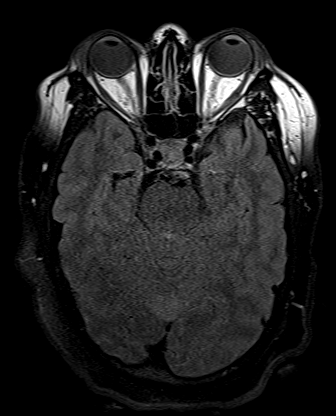
[im 37/55]
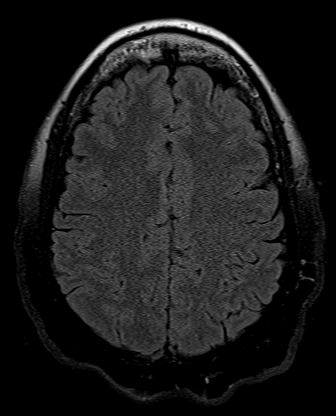
[im 55/55]
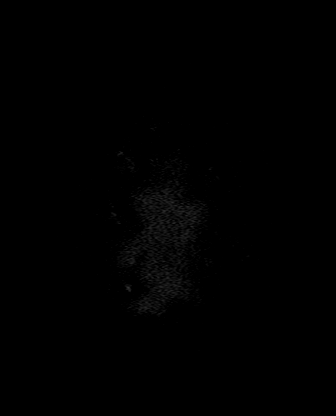

[Series 16: T1 · axial · 1.0mm · 0.98mm/px · z∈[-144,+30]mm · 12 of 176 slices shown (2 of 2)]
[im 1/176]
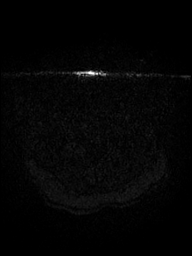
[im 16/176]
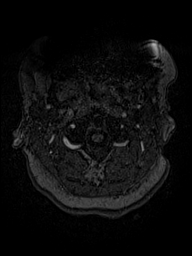
[im 32/176]
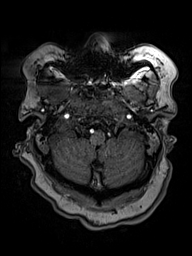
[im 48/176]
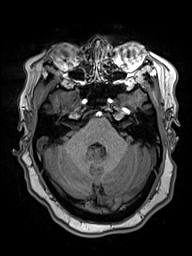
[im 64/176]
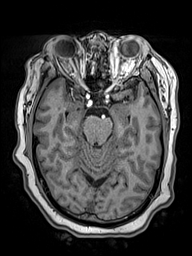
[im 80/176]
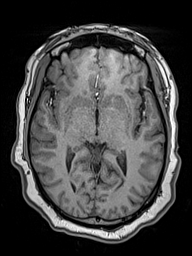
[im 96/176]
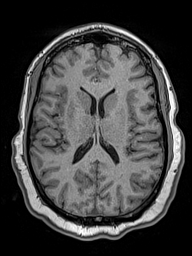
[im 112/176]
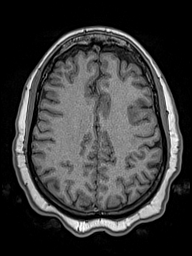
[im 128/176]
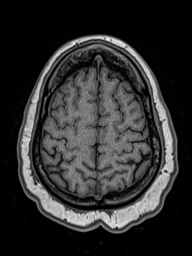
[im 144/176]
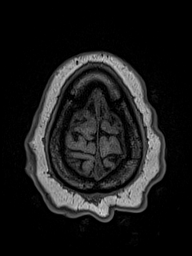
[im 160/176]
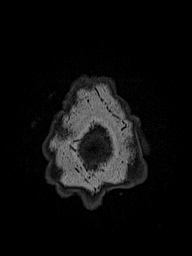
[im 176/176]
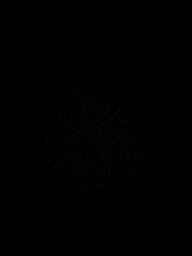

[Series 17: T2 · coronal · 5.0mm · 0.57mm/px · 2 of 29 slices shown (2 of 2)]
[im 1/29]
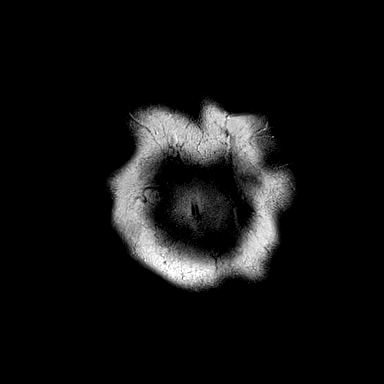
[im 29/29]
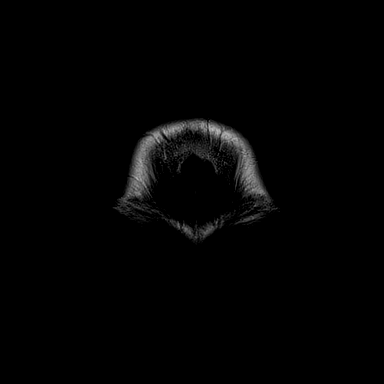

[Series 18: FLAIR · sagittal · 5.0mm · 0.94mm/px · 2 of 23 slices shown (2 of 2)]
[im 1/23]
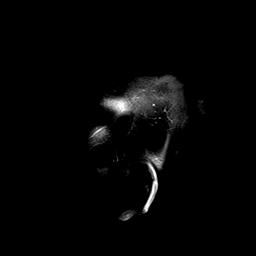
[im 23/23]
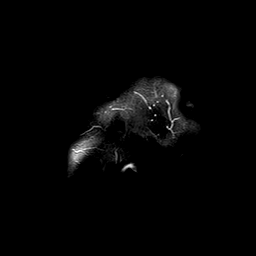

[48 of 48 positions shown; findings below may reference images not displayed]

FINDINGS: Brain:

Susceptibility artifact arising from dental braces obscures portions
of the intracranial contents on multiple sequences, limiting
evaluation. The diffusion-weighted and SWI sequences are most
notably affected.

Cerebral volume is normal. Within described limitations, no cortical
encephalomalacia, significant cerebral white matter disease, acute
infarct, intracranial mass, extra-axial fluid collection or chronic
intracranial blood products identified. No midline shift.

Vascular: Maintained flow voids within the proximal large arterial
vessels.

Skull and upper cervical spine: No focal suspicious marrow lesion.

Sinuses/Orbits: Visualized orbits show no acute finding.
Susceptibility artifact obscures portions of the orbits and
paranasal sinuses on some sequences. Within this limitation, there
is no appreciable acute orbital abnormality or significant paranasal
sinus disease.
IMPRESSION: Examination limited by susceptibility artifact arising from dental
braces.

Within this limitation, there is an unremarkable non-contrast MRI
appearance of the brain. No evidence of acute intracranial
abnormality.

## 2022-08-14 LAB — COMPLIANCE DRUG ANALYSIS, UR

## 2022-08-18 ENCOUNTER — Telehealth: Payer: Self-pay

## 2022-08-18 DIAGNOSIS — D860 Sarcoidosis of lung: Secondary | ICD-10-CM | POA: Diagnosis not present

## 2022-08-18 NOTE — Telephone Encounter (Signed)
Insurance Treatment Denial Note  Date order was entered:  Order entered by: Delano Metz, MD Requested treatment: MRI wrist Reason for denial:  see below Recommended for approval: If the requested service is done and it helps the patient's symptoms, there is a plan to do a procedure that decreases the pain by heating the nerve tissue (RFA).    She has not completed 6 weeks of provider directed treatment. The treatment did not occur within the last 3 months. Criteria not met. Denied.

## 2022-08-20 LAB — VITAMIN B12: Vitamin B-12: >2000 pg/mL — ABNORMAL HIGH (ref 232–1245)

## 2022-08-20 LAB — COMP. METABOLIC PANEL (12)
Albumin: 4.3 g/dL (ref 3.9–4.9)
BUN/Creatinine Ratio: 19 (ref 9–23)
BUN: 16 mg/dL (ref 6–20)
Bilirubin Total: <0.2 mg/dL (ref 0.0–1.2)
Calcium: 9.6 mg/dL (ref 8.7–10.2)
Chloride: 101 mmol/L (ref 96–106)
Creatinine, Ser: 0.84 mg/dL (ref 0.57–1.00)
Globulin, Total: 2.7 g/dL (ref 1.5–4.5)
Glucose: 77 mg/dL (ref 70–99)
Potassium: 4.1 mmol/L (ref 3.5–5.2)
Sodium: 138 mmol/L (ref 134–144)
Total Protein: 7 g/dL (ref 6.0–8.5)

## 2022-08-20 LAB — 25-HYDROXY VITAMIN D LCMS D2+D3
25-Hydroxy, Vitamin D-2: 60 ng/mL
25-Hydroxy, Vitamin D: 65 ng/mL

## 2022-09-04 DIAGNOSIS — G4733 Obstructive sleep apnea (adult) (pediatric): Secondary | ICD-10-CM | POA: Diagnosis not present

## 2022-09-04 DIAGNOSIS — R5383 Other fatigue: Secondary | ICD-10-CM | POA: Diagnosis not present

## 2022-09-04 DIAGNOSIS — G629 Polyneuropathy, unspecified: Secondary | ICD-10-CM | POA: Diagnosis not present

## 2022-09-04 DIAGNOSIS — D869 Sarcoidosis, unspecified: Secondary | ICD-10-CM | POA: Diagnosis not present

## 2022-09-04 DIAGNOSIS — F419 Anxiety disorder, unspecified: Secondary | ICD-10-CM | POA: Diagnosis not present

## 2022-09-04 DIAGNOSIS — K219 Gastro-esophageal reflux disease without esophagitis: Secondary | ICD-10-CM | POA: Diagnosis not present

## 2022-09-04 DIAGNOSIS — D860 Sarcoidosis of lung: Secondary | ICD-10-CM | POA: Diagnosis not present

## 2022-09-04 DIAGNOSIS — R200 Anesthesia of skin: Secondary | ICD-10-CM | POA: Diagnosis not present

## 2022-09-04 DIAGNOSIS — E669 Obesity, unspecified: Secondary | ICD-10-CM | POA: Diagnosis not present

## 2022-09-04 DIAGNOSIS — I10 Essential (primary) hypertension: Secondary | ICD-10-CM | POA: Diagnosis not present

## 2022-09-05 ENCOUNTER — Encounter: Payer: Self-pay | Admitting: Pain Medicine

## 2022-09-10 ENCOUNTER — Ambulatory Visit: Payer: 59 | Admitting: Pain Medicine

## 2022-09-26 ENCOUNTER — Ambulatory Visit
Admission: EM | Admit: 2022-09-26 | Discharge: 2022-09-26 | Disposition: A | Discharge status: Home / Self Care | Payer: 59 | Source: Home / Self Care

## 2022-09-26 ENCOUNTER — Encounter: Payer: Self-pay | Admitting: Emergency Medicine

## 2022-09-26 DIAGNOSIS — M79651 Pain in right thigh: Secondary | ICD-10-CM | POA: Diagnosis not present

## 2022-09-26 DIAGNOSIS — Z1152 Encounter for screening for COVID-19: Secondary | ICD-10-CM | POA: Diagnosis not present

## 2022-09-26 DIAGNOSIS — M79652 Pain in left thigh: Secondary | ICD-10-CM | POA: Insufficient documentation

## 2022-09-26 DIAGNOSIS — M7918 Myalgia, other site: Secondary | ICD-10-CM | POA: Diagnosis not present

## 2022-09-26 DIAGNOSIS — Z87891 Personal history of nicotine dependence: Secondary | ICD-10-CM | POA: Insufficient documentation

## 2022-09-26 DIAGNOSIS — R519 Headache, unspecified: Secondary | ICD-10-CM | POA: Insufficient documentation

## 2022-09-26 LAB — SARS CORONAVIRUS 2 BY RT PCR: SARS Coronavirus 2 by RT PCR: NEGATIVE

## 2022-09-26 MED ORDER — CYCLOBENZAPRINE HCL 5 MG PO TABS: 5.0000 mg | ORAL_TABLET | Freq: Three times a day (TID) | ORAL | 0 refills | Status: AC | PRN
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
## 2022-09-26 NOTE — Discharge Instructions (Signed)
Your pain is most likely caused by irritation to the muscles.  COVID testing is negative  Headache can be related to stress due to event as well as tension on the body  Ensure that you are drinking lots of water and getting good rest as dehydration and fatigue will make the headache worse  While headache is present participate in low stimulation activities and avoid bright lights and loud noises  Continue use of your daily naproxen, take Tylenol every 6 hours as needed, may take 500 to 1000 mg  May use muscle relaxant every 8 hours for additional comfort, be mindful this will make you sleepy, may use throughout the daytime if you are home  You may use heating pad in 15 minute intervals as needed for additional comfort, within the first 2-3 days you may find comfort in using ice in 10-15 minutes over affected area  Begin massaging or stretching affected area daily for 10 minutes as tolerated to further loosen muscles   When sitting or lying down place pillow underneath and between knees for support  If pain persist after recommended treatment or reoccurs you may follow-up with his urgent care or your primary doctor for reevaluation of symptoms
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
## 2023-02-18 ENCOUNTER — Other Ambulatory Visit: Payer: Self-pay | Admitting: Medical Genetics

## 2023-02-26 ENCOUNTER — Other Ambulatory Visit: Payer: Self-pay

## 2023-03-02 ENCOUNTER — Other Ambulatory Visit
Admission: RE | Admit: 2023-03-02 | Discharge: 2023-03-02 | Disposition: A | Discharge status: Home / Self Care | Payer: Self-pay | Source: Ambulatory Visit | Attending: Medical Genetics | Admitting: Medical Genetics

## 2023-03-13 LAB — GENECONNECT MOLECULAR SCREEN: Genetic Analysis Overall Interpretation: NEGATIVE

## 2023-09-03 ENCOUNTER — Ambulatory Visit
Admission: EM | Admit: 2023-09-03 | Discharge: 2023-09-03 | Disposition: A | Discharge status: Home / Self Care | Attending: Physician Assistant | Admitting: Physician Assistant

## 2023-09-03 ENCOUNTER — Ambulatory Visit: Payer: Self-pay | Admitting: Physician Assistant

## 2023-09-03 ENCOUNTER — Ambulatory Visit (INDEPENDENT_AMBULATORY_CARE_PROVIDER_SITE_OTHER)

## 2023-09-03 DIAGNOSIS — R051 Acute cough: Secondary | ICD-10-CM | POA: Insufficient documentation

## 2023-09-03 DIAGNOSIS — B349 Viral infection, unspecified: Secondary | ICD-10-CM | POA: Diagnosis present

## 2023-09-03 DIAGNOSIS — R0789 Other chest pain: Secondary | ICD-10-CM

## 2023-09-03 DIAGNOSIS — D869 Sarcoidosis, unspecified: Secondary | ICD-10-CM | POA: Insufficient documentation

## 2023-09-03 DIAGNOSIS — R519 Headache, unspecified: Secondary | ICD-10-CM | POA: Insufficient documentation

## 2023-09-03 LAB — SARS CORONAVIRUS 2 BY RT PCR: SARS Coronavirus 2 by RT PCR: NEGATIVE

## 2023-09-03 MED ORDER — PREDNISONE 20 MG PO TABS: 40.0000 mg | ORAL_TABLET | Freq: Every day | ORAL | 0 refills | Status: AC

## 2023-09-03 MED ORDER — PROMETHAZINE-DM 6.25-15 MG/5ML PO SYRP: 5.0000 mL | ORAL_SOLUTION | Freq: Four times a day (QID) | ORAL | 0 refills | Status: AC | PRN

## 2023-09-03 NOTE — ED Provider Notes (Signed)
 [facility]    CSN: [id number] Arrival date & time: 09/03/23  1704      History   Chief Complaint Chief Complaint  Patient presents with   Headache    HPI Samantha [name] is a 38 y.o. female presenting for 3-4 day history of headaches, fatigue, nasal congestion postnasal drainage, sneezing, cough, runny nose, chest tightness.  Denies fever, ear pain, sore throat, shortness of breath, vomiting or diarrhea.  Patient unsure of any sick contacts.  Has not taken any OTC meds recently.  She has history of sarcoidosis and has an inhaler in case she needs it but has not needed it.  HPI  Past Medical History:  Diagnosis Date   Anxiety    Depression    Hypertension    Pre-diabetes     Patient Active Problem List   Diagnosis Date Noted   Sarcoidosis 08/11/2022   Chronic wrist pain (1ry area of Pain) (Right) 08/11/2022   Chronic foot pain and (2ry area of Pain) (Right) 08/11/2022   Chronic chest pain (3ry area of Pain) (w/ hx. Sarcoidosis) 08/11/2022   Constant pain (right wrist, right foot, chest) 08/11/2022   Intermittent pain (upper extremities, shoulders, lower extremities) 08/11/2022   Night sweats 08/11/2022   Muscle spasms of lower extremities (Bilateral) 08/11/2022   Small fiber neuropathy (Sarcoidosis) 08/11/2022   Chronic pain syndrome 08/07/2022   Pharmacologic therapy 08/07/2022   Disorder of skeletal system 08/07/2022   Problems influencing health status 08/07/2022   Chronic use of opiate for therapeutic purpose 08/07/2022   Seasonal allergic rhinitis due to pollen 05/07/2022   OSA on CPAP 01/17/2022   Other fatigue 01/17/2022   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 36.9 in adult 10/08/2021   Vitamin D  deficiency 07/09/2021   Chronic nausea 01/07/2021   Dry eye syndrome, bilateral 01/07/2021   Dry mouth 01/07/2021   Sarcoidosis of lung (HCC) 01/07/2021   Muscle spasm 11/15/2020   Numbness and  tingling 11/15/2020   Chest mass 07/04/2020   Essential hypertension 12/30/2019   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   Prediabetes 12/22/2019   Depression 12/21/2019   Hemorrhoids 12/21/2019   Migraines 12/21/2019   Anxiety 09/23/2017   Tension type headache 09/02/2017   Well adult exam 09/02/2017    Past Surgical History:  Procedure Laterality Date   NO PAST SURGERIES     VIDEO BRONCHOSCOPY WITH ENDOBRONCHIAL ULTRASOUND N/A 09/28/2020   Procedure: VIDEO BRONCHOSCOPY WITH ENDOBRONCHIAL ULTRASOUND;  Surgeon: Parris Manna, MD;  Location: [facility];  Service: Thoracic;  Laterality: N/A;    OB History     Gravida  2   Para      Term      Preterm      AB  1   Living  1      SAB      IAB      Ectopic      Multiple      Live Births               Home Medications    Prior to Admission medications   Medication Sig Start Date End Date Taking? Authorizing Provider  amLODipine (NORVASC) 5 MG tablet Take 5 mg by mouth daily. 07/04/20  Yes [provider]  DULoxetine (CYMBALTA) 60 MG capsule Take 1 tablet by mouth daily. 03/20/22  Yes [provider]  hydrochlorothiazide (HYDRODIURIL) 12.5 MG tablet Take 12.5 mg by mouth daily. 03/20/19  Yes [provider]  lisdexamfetamine (VYVANSE) 30 MG capsule Take 30 mg by mouth daily.   Yes [provider]  losartan (COZAAR) 25 MG tablet Take 25 mg by mouth daily.   Yes [provider]  metoprolol succinate (TOPROL-XL) 50 MG 24 hr tablet Take 1 tablet by mouth daily. 04/01/22 09/03/23 Yes [provider]  predniSONE  (DELTASONE ) 20 MG tablet Take 2 tablets (40 mg total) by mouth daily for 5 days. 09/03/23 09/08/23 Yes Arvis Huxley B, PA-C  promethazine -dextromethorphan (PROMETHAZINE -DM) 6.25-15 MG/5ML syrup Take 5 mLs by mouth 4 (four) times daily as needed. 09/03/23  Yes Arvis Huxley B, PA-C  sertraline (ZOLOFT) 100 MG tablet Take 1 tablet by mouth daily.   Yes [provider]  budesonide-formoterol (SYMBICORT) 80-4.5 MCG/ACT inhaler Inhale into the lungs. 03/17/22 03/17/23  [provider]  cyclobenzaprine  (FLEXERIL ) 5 MG tablet Take 1 tablet (5 mg total) by mouth 3 (three) times daily as needed for muscle spasms. 09/26/22   White, Shelba SAUNDERS, NP  gabapentin (NEURONTIN) 100 MG capsule TAKE 1 TO 2 CAPSULES BY MOUTH NIGHTLY AND CAN TAKE 1 ADDITIONAL CAPSULE NIGHTLY AS NEEDED FOR PAIN 01/06/22   [provider]  MedroxyPROGESTERone  Acetate 150 MG/ML SUSY Inject 1 mL (150 mg total) as directed once. Patient taking differently: Inject 150 mg as directed every 3 (three) months. 10/08/16 08/11/22  Copland, Alicia B, PA-C  naproxen (NAPROSYN) 500 MG tablet Take 500 mg by mouth 2 (two) times daily.    [provider]  nortriptyline (PAMELOR) 10 MG capsule Take 1 capsule by mouth every morning. 04/09/21   [provider]  nortriptyline (PAMELOR) 50 MG capsule Take 50 mg by mouth at bedtime.    [provider]  omeprazole (PRILOSEC) 20 MG capsule Take by mouth. 04/01/22 04/01/23  [provider]  tiZANidine (ZANAFLEX) 2 MG tablet TAKE 1 TABLET (2 MG TOTAL) BY MOUTH 3 (THREE) TIMES DAILY AS NEEDED FOR UP TO 90 DAYS 01/29/22   [provider]  traZODone (DESYREL) 100 MG tablet Take 100 mg by mouth at bedtime. 08/30/20   [provider]  WEGOVY 0.5 MG/0.5ML SOAJ Inject into the skin.    [provider]    Family History Family History  Problem Relation Age of Onset   Healthy Mother     Social History Social History   Tobacco Use   Smoking status: Former    Current packs/day: 0.00    Types: Cigarettes    Quit date: 07/2018    Years since quitting: 5.1   Smokeless tobacco: Never  Vaping Use   Vaping status: Never Used  Substance Use Topics   Alcohol  use: Yes    Comment: socially   Drug use: No     Allergies   Patient has no known allergies.   Review of Systems Review of  Systems  Constitutional:  Positive for fatigue. Negative for chills, diaphoresis and fever.  HENT:  Positive for congestion and rhinorrhea. Negative for ear pain, sinus pressure, sinus pain and sore throat.   Respiratory:  Positive for cough and chest tightness. Negative for shortness of breath.   Cardiovascular:  Negative for chest pain.  Gastrointestinal:  Negative for abdominal pain, nausea and vomiting.  Musculoskeletal:  Negative for arthralgias and myalgias.  Skin:  Negative for rash.  Neurological:  Positive for headaches. Negative for weakness.  Hematological:  Negative for adenopathy.     Physical Exam Triage Vital Signs ED Triage Vitals  Encounter Vitals Group  BP 09/03/23 1729 116/80     Girls Systolic BP Percentile --      Girls Diastolic BP Percentile --      Boys Systolic BP Percentile --      Boys Diastolic BP Percentile --      Pulse Rate 09/03/23 1729 75     Resp 09/03/23 1729 17     Temp 09/03/23 1729 98.7 F (37.1 C)     Temp Source 09/03/23 1729 Oral     SpO2 09/03/23 1729 98 %     Weight --      Height --      Head Circumference --      Peak Flow --      Pain Score 09/03/23 1726 0     Pain Loc --      Pain Education --      Exclude from Growth Chart --    No data found.  Updated Vital Signs BP 116/80 (BP Location: Right Arm)   Pulse 75   Temp 98.7 F (37.1 C) (Oral)   Resp 17   SpO2 98%     Physical Exam Vitals and nursing note reviewed.  Constitutional:      General: She is not in acute distress.    Appearance: Normal appearance. She is not ill-appearing or toxic-appearing.  HENT:     Head: Normocephalic and atraumatic.     Nose: Congestion present.     Mouth/Throat:     Mouth: Mucous membranes are moist.     Pharynx: Oropharynx is clear.  Eyes:     General: No scleral icterus.       Right eye: No discharge.        Left eye: No discharge.     Conjunctiva/sclera: Conjunctivae normal.  Cardiovascular:     Rate and Rhythm: Normal  rate and regular rhythm.     Heart sounds: Normal heart sounds.  Pulmonary:     Effort: Pulmonary effort is normal. No respiratory distress.     Breath sounds: Normal breath sounds.  Musculoskeletal:     Cervical back: Neck supple.  Skin:    General: Skin is dry.  Neurological:     General: No focal deficit present.     Mental Status: She is alert. Mental status is at baseline.     Motor: No weakness.     Gait: Gait normal.  Psychiatric:        Mood and Affect: Mood normal.        Behavior: Behavior normal.      UC Treatments / Results  Labs (all labs ordered are listed, but only abnormal results are displayed) Labs Reviewed  SARS CORONAVIRUS 2 BY RT PCR    EKG   Radiology DG Chest 2 View Result Date: 09/03/2023 CLINICAL DATA:  Congestion EXAM: CHEST - 2 VIEW COMPARISON:  04/05/2022 FINDINGS: The heart size and mediastinal contours are within normal limits. Both lungs are clear. The visualized skeletal structures are unremarkable. IMPRESSION: No active cardiopulmonary disease. Electronically Signed   By: Luke Bun M.D.   On: 09/03/2023 18:30    Procedures Procedures (including critical care time)  Medications Ordered in UC Medications - No data to display  Initial Impression / Assessment and Plan / UC Course  I have reviewed the triage vital signs and the nursing notes.  Pertinent labs & imaging results that were available during my care of the patient were reviewed by me and considered in my medical decision making (see chart  for details).   38 year old female with history of sarcoidosis presents for 3 to 4-day history of cough, congestion, fatigue, headaches, chest tightness.  No fever or shortness of breath.  Vitals are stable and normal and she is overall well-appearing.  No acute distress.  On exam she has nasal congestion.  Throat is clear.  Chest clear.  COVID test and chest x-ray obtained.  Chest x-ray is negative.  Will call with any positive COVID  results.  At this time advised symptoms are likely viral and care is supportive.  Sent Promethazine  DM to pharmacy.  Encouraged use of inhaler as needed.  Prescription for prednisone  given in case symptoms worsen or do not improve.  Advised to return or go to ER for acute worsening of symptoms.  COVID negative.    Final Clinical Impressions(s) / UC Diagnoses   Final diagnoses:  Chest tightness  Viral illness  Acute cough  Acute nonintractable headache, unspecified headache type  Sarcoidosis     Discharge Instructions      -Pending COVID test and chest xray results - Will call with any positive results. -Viral illness.  Supportive care encouraged.  I sent cough medicine and prednisone  to pharmacy.  Use inhaler as needed - If positive for COVID should isolate until fever free 24 hours and feeling better.  These are CDC guidelines.    ED Prescriptions     Medication Sig Dispense Auth. Provider   predniSONE  (DELTASONE ) 20 MG tablet Take 2 tablets (40 mg total) by mouth daily for 5 days. 10 tablet Arvis Huxley B, PA-C   promethazine -dextromethorphan (PROMETHAZINE -DM) 6.25-15 MG/5ML syrup Take 5 mLs by mouth 4 (four) times daily as needed. 118 mL Arvis Huxley NOVAK, PA-C      PDMP not reviewed this encounter.   Arvis Huxley NOVAK, PA-C 09/03/23 1928

## 2023-09-03 NOTE — Discharge Instructions (Signed)
-  Pending COVID test and chest xray results - Will call with any positive results. -Viral illness.  Supportive care encouraged.  I sent cough medicine and prednisone  to pharmacy.  Use inhaler as needed - If positive for COVID should isolate until fever free 24 hours and feeling better.  These are CDC guidelines.

## 2023-09-03 NOTE — ED Triage Notes (Signed)
 Sx x 5 days  Headache Sinus drainage Sneezing Chest tightness  Patient states that she pulmonary Sarcoidosis

## 2024-02-16 ENCOUNTER — Other Ambulatory Visit: Payer: Self-pay | Admitting: Internal Medicine

## 2024-02-16 DIAGNOSIS — R079 Chest pain, unspecified: Secondary | ICD-10-CM

## 2024-02-16 DIAGNOSIS — G4733 Obstructive sleep apnea (adult) (pediatric): Secondary | ICD-10-CM

## 2024-02-16 DIAGNOSIS — D869 Sarcoidosis, unspecified: Secondary | ICD-10-CM

## 2024-03-12 ENCOUNTER — Ambulatory Visit
Admission: RE | Admit: 2024-03-12 | Discharge: 2024-03-12 | Disposition: A | Discharge status: Home / Self Care | Attending: Family Medicine | Admitting: Family Medicine

## 2024-03-12 VITALS — BP 127/92 | HR 74 | Temp 98.5°F | Resp 15 | Ht 64.0 in | Wt 218.7 lb

## 2024-03-12 DIAGNOSIS — S60449A External constriction of unspecified finger, initial encounter: Secondary | ICD-10-CM

## 2024-03-12 DIAGNOSIS — W4904XA Ring or other jewelry causing external constriction, initial encounter: Secondary | ICD-10-CM | POA: Diagnosis not present

## 2024-03-12 NOTE — ED Triage Notes (Signed)
 Patient states that she has had swelling in her right 2nd finger for a day and cannot get her ring off her finger. Patient reports some pain this morning.

## 2024-03-12 NOTE — ED Provider Notes (Signed)
 " [facility]    CSN: [id number] Arrival date & time: 03/12/24  0810      History   Chief Complaint Chief Complaint  Patient presents with   Finger Swelling    Right 2nd finger    HPI 39 year old female presents with finger swelling.  Patient has some swelling of the right index finger and her ring is stuck on.  She has attempted to remove it without success.  Denies any injury.  No relieving factors.  Past Medical History:  Diagnosis Date   Anxiety    Depression    Hypertension    Pre-diabetes     Patient Active Problem List   Diagnosis Date Noted   Sarcoidosis 08/11/2022   Chronic wrist pain (1ry area of Pain) (Right) 08/11/2022   Chronic foot pain and (2ry area of Pain) (Right) 08/11/2022   Chronic chest pain (3ry area of Pain) (w/ hx. Sarcoidosis) 08/11/2022   Constant pain (right wrist, right foot, chest) 08/11/2022   Intermittent pain (upper extremities, shoulders, lower extremities) 08/11/2022   Night sweats 08/11/2022   Muscle spasms of lower extremities (Bilateral) 08/11/2022   Small fiber neuropathy (Sarcoidosis) 08/11/2022   Chronic pain syndrome 08/07/2022   Pharmacologic therapy 08/07/2022   Disorder of skeletal system 08/07/2022   Problems influencing health status 08/07/2022   Chronic use of opiate for therapeutic purpose 08/07/2022   Seasonal allergic rhinitis due to pollen 05/07/2022   OSA on CPAP 01/17/2022   Other fatigue 01/17/2022   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 36.9 in adult 10/08/2021   Vitamin D  deficiency 07/09/2021   Chronic nausea 01/07/2021   Dry eye syndrome, bilateral 01/07/2021   Dry mouth 01/07/2021   Sarcoidosis of lung 01/07/2021   Muscle spasm 11/15/2020   Numbness and tingling 11/15/2020   Chest mass 07/04/2020   Essential hypertension 12/30/2019   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   Prediabetes 12/22/2019   Depression 12/21/2019    Hemorrhoids 12/21/2019   Migraines 12/21/2019   Anxiety 09/23/2017   Tension type headache 09/02/2017   Well adult exam 09/02/2017    Past Surgical History:  Procedure Laterality Date   NO PAST SURGERIES     VIDEO BRONCHOSCOPY WITH ENDOBRONCHIAL ULTRASOUND N/A 09/28/2020   Procedure: VIDEO BRONCHOSCOPY WITH ENDOBRONCHIAL ULTRASOUND;  Surgeon: Parris Manna, MD;  Location: [facility];  Service: Thoracic;  Laterality: N/A;    OB History     Gravida  2   Para      Term      Preterm      AB  1   Living  1      SAB      IAB      Ectopic      Multiple      Live Births               Home Medications    Prior to Admission medications  Medication Sig Start Date End Date Taking? Authorizing Provider  amLODipine (NORVASC) 5 MG tablet Take 5 mg by mouth daily. 07/04/20   [provider]  budesonide-formoterol (SYMBICORT) 80-4.5 MCG/ACT inhaler Inhale into the lungs. 03/17/22 03/17/23  [provider]  cyclobenzaprine  (FLEXERIL ) 5 MG tablet Take 1 tablet (5 mg total) by mouth 3 (three) times daily as needed for muscle spasms. 09/26/22   Teresa Shelba SAUNDERS, NP  DULoxetine (CYMBALTA) 60 MG capsule Take 1 tablet by mouth daily. 03/20/22  [provider]  gabapentin (NEURONTIN) 100 MG capsule TAKE 1 TO 2 CAPSULES BY MOUTH NIGHTLY AND CAN TAKE 1 ADDITIONAL CAPSULE NIGHTLY AS NEEDED FOR PAIN 01/06/22   [provider]  hydrochlorothiazide (HYDRODIURIL) 12.5 MG tablet Take 12.5 mg by mouth daily. 03/20/19   [provider]  lisdexamfetamine (VYVANSE) 30 MG capsule Take 30 mg by mouth daily.    [provider]  losartan (COZAAR) 25 MG tablet Take 25 mg by mouth daily.    [provider]  MedroxyPROGESTERone  Acetate 150 MG/ML SUSY Inject 1 mL (150 mg total) as directed once. Patient taking differently: Inject 150 mg as directed every 3 (three) months. 10/08/16 08/11/22  Copland, Alicia B, PA-C  metoprolol succinate (TOPROL-XL) 50  MG 24 hr tablet Take 1 tablet by mouth daily. 04/01/22 09/03/23  [provider]  naproxen (NAPROSYN) 500 MG tablet Take 500 mg by mouth 2 (two) times daily.    [provider]  nortriptyline (PAMELOR) 10 MG capsule Take 1 capsule by mouth every morning. 04/09/21   [provider]  nortriptyline (PAMELOR) 50 MG capsule Take 50 mg by mouth at bedtime.    [provider]  omeprazole (PRILOSEC) 20 MG capsule Take by mouth. 04/01/22 04/01/23  [provider]  promethazine -dextromethorphan (PROMETHAZINE -DM) 6.25-15 MG/5ML syrup Take 5 mLs by mouth 4 (four) times daily as needed. 09/03/23   Arvis Huxley B, PA-C  sertraline (ZOLOFT) 100 MG tablet Take 1 tablet by mouth daily.    [provider]  tiZANidine (ZANAFLEX) 2 MG tablet TAKE 1 TABLET (2 MG TOTAL) BY MOUTH 3 (THREE) TIMES DAILY AS NEEDED FOR UP TO 90 DAYS 01/29/22   [provider]  traZODone (DESYREL) 100 MG tablet Take 100 mg by mouth at bedtime. 08/30/20   [provider]  WEGOVY 0.5 MG/0.5ML SOAJ Inject into the skin.    [provider]    Family History Family History  Problem Relation Age of Onset   Healthy Mother     Social History Social History[1]   Allergies   Patient has no known allergies.   Review of Systems Review of Systems Per HPI  Physical Exam Triage Vital Signs ED Triage Vitals  Encounter Vitals Group     BP 03/12/24 0820 (!) 127/92     Girls Systolic BP Percentile --      Girls Diastolic BP Percentile --      Boys Systolic BP Percentile --      Boys Diastolic BP Percentile --      Pulse Rate 03/12/24 0820 74     Resp 03/12/24 0820 15     Temp 03/12/24 0820 98.5 F (36.9 C)     Temp Source 03/12/24 0820 Oral     SpO2 03/12/24 0820 100 %     Weight 03/12/24 0819 218 lb 11.1 oz (99.2 kg)     Height 03/12/24 0819 5' 4 (1.626 m)     Head Circumference --      Peak Flow --      Pain Score 03/12/24 0819 4     Pain Loc --       Pain Education --      Exclude from Growth Chart --    Updated Vital Signs BP (!) 127/92 (BP Location: Left Arm)   Pulse 74   Temp 98.5 F (36.9 C) (Oral)   Resp 15   Ht 5' 4 (1.626 m)   Wt 99.2 kg   SpO2 100%  BMI 37.54 kg/m   Visual Acuity Right Eye Distance:   Left Eye Distance:   Bilateral Distance:    Right Eye Near:   Left Eye Near:    Bilateral Near:     Physical Exam Vitals and nursing note reviewed.  Constitutional:      General: She is not in acute distress.    Appearance: Normal appearance.  HENT:     Head: Normocephalic and atraumatic.  Musculoskeletal:     Comments: Mild swelling of the right index finger.  Ring is unable to be easily removed.  Neurological:     Mental Status: She is alert.      UC Treatments / Results  Labs (all labs ordered are listed, but only abnormal results are displayed) Labs Reviewed - No data to display  EKG   Radiology No results found.  Procedures Procedures (including critical care time) Ring cutter was used and ring was cut with mild difficulty.  Pliers were used to further open and the ring was subsequently slid off easily.  Medications Ordered in UC Medications - No data to display  Initial Impression / Assessment and Plan / UC Course  I have reviewed the triage vital signs and the nursing notes.  Pertinent labs & imaging results that were available during my care of the patient were reviewed by me and considered in my medical decision making (see chart for details).    39 year old female presents with a ring stuck on her finger.  Ring cutter was used and ring was removed today.  No preceding injury.  Supportive care.  Final Clinical Impressions(s) / UC Diagnoses   Final diagnoses:  Tight ring on finger   Discharge Instructions   None    ED Prescriptions   None    PDMP not reviewed this encounter.    [1]  Social History Tobacco Use   Smoking status: Former    Current packs/day: 0.00     Average packs/day: 0.5 packs/day    Types: Cigarettes    Quit date: 07/2018    Years since quitting: 5.6   Smokeless tobacco: Never  Vaping Use   Vaping status: Never Used  Substance Use Topics   Alcohol  use: Yes    Comment: socially   Drug use: No     Ladislaus Repsher G, DO 03/12/24 [id number]  "
